# Patient Record
Sex: Female | Born: 1960 | Race: White | Hispanic: No | Marital: Married | Smoking: Never smoker
Health system: Southern US, Community
[De-identification: ages and names within clinical notes are randomized; demographics above are authoritative.]

## PROBLEM LIST (undated history)

## (undated) DIAGNOSIS — M1990 Unspecified osteoarthritis, unspecified site: Secondary | ICD-10-CM

## (undated) DIAGNOSIS — F32A Depression, unspecified: Secondary | ICD-10-CM

## (undated) DIAGNOSIS — K219 Gastro-esophageal reflux disease without esophagitis: Secondary | ICD-10-CM

## (undated) DIAGNOSIS — E786 Lipoprotein deficiency: Secondary | ICD-10-CM

## (undated) DIAGNOSIS — F419 Anxiety disorder, unspecified: Secondary | ICD-10-CM

## (undated) DIAGNOSIS — E119 Type 2 diabetes mellitus without complications: Secondary | ICD-10-CM

## (undated) HISTORY — PX: ROOT CANAL: SHX2363

---
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
## 2014-08-29 DIAGNOSIS — F32A Depression, unspecified: Secondary | ICD-10-CM | POA: Insufficient documentation

## 2014-08-29 DIAGNOSIS — K219 Gastro-esophageal reflux disease without esophagitis: Secondary | ICD-10-CM | POA: Insufficient documentation

## 2014-08-29 DIAGNOSIS — F4321 Adjustment disorder with depressed mood: Secondary | ICD-10-CM | POA: Insufficient documentation

## 2014-08-29 DIAGNOSIS — G4700 Insomnia, unspecified: Secondary | ICD-10-CM | POA: Insufficient documentation

## 2015-04-30 DIAGNOSIS — L918 Other hypertrophic disorders of the skin: Secondary | ICD-10-CM | POA: Insufficient documentation

## 2015-04-30 DIAGNOSIS — Z79899 Other long term (current) drug therapy: Secondary | ICD-10-CM | POA: Insufficient documentation

## 2015-08-03 DIAGNOSIS — Z0000 Encounter for general adult medical examination without abnormal findings: Secondary | ICD-10-CM | POA: Insufficient documentation

## 2016-08-08 DIAGNOSIS — M25551 Pain in right hip: Secondary | ICD-10-CM | POA: Insufficient documentation

## 2017-04-07 DIAGNOSIS — M25531 Pain in right wrist: Secondary | ICD-10-CM | POA: Insufficient documentation

## 2019-01-21 DIAGNOSIS — E1165 Type 2 diabetes mellitus with hyperglycemia: Secondary | ICD-10-CM | POA: Insufficient documentation

## 2021-04-24 ENCOUNTER — Other Ambulatory Visit: Payer: Self-pay

## 2021-04-24 ENCOUNTER — Emergency Department (HOSPITAL_BASED_OUTPATIENT_CLINIC_OR_DEPARTMENT_OTHER)
Admission: EM | Admit: 2021-04-24 | Discharge: 2021-04-24 | Disposition: A | Discharge status: Home / Self Care | Payer: BC Managed Care – PPO | Attending: Emergency Medicine | Admitting: Emergency Medicine

## 2021-04-24 ENCOUNTER — Encounter (HOSPITAL_BASED_OUTPATIENT_CLINIC_OR_DEPARTMENT_OTHER): Payer: Self-pay | Admitting: Urology

## 2021-04-24 ENCOUNTER — Emergency Department (HOSPITAL_BASED_OUTPATIENT_CLINIC_OR_DEPARTMENT_OTHER): Payer: BC Managed Care – PPO

## 2021-04-24 DIAGNOSIS — R220 Localized swelling, mass and lump, head: Secondary | ICD-10-CM | POA: Insufficient documentation

## 2021-04-24 DIAGNOSIS — L03211 Cellulitis of face: Secondary | ICD-10-CM

## 2021-04-24 DIAGNOSIS — E1169 Type 2 diabetes mellitus with other specified complication: Secondary | ICD-10-CM | POA: Insufficient documentation

## 2021-04-24 DIAGNOSIS — E7800 Pure hypercholesterolemia, unspecified: Secondary | ICD-10-CM | POA: Diagnosis not present

## 2021-04-24 HISTORY — DX: Lipoprotein deficiency: E78.6

## 2021-04-24 HISTORY — DX: Depression, unspecified: F32.A

## 2021-04-24 HISTORY — DX: Anxiety disorder, unspecified: F41.9

## 2021-04-24 HISTORY — DX: Type 2 diabetes mellitus without complications: E11.9

## 2021-04-24 LAB — CBC WITH DIFFERENTIAL/PLATELET
Abs Immature Granulocytes: 0.02 10*3/uL (ref 0.00–0.07)
Basophils Absolute: 0 10*3/uL (ref 0.0–0.1)
Basophils Relative: 1 %
Eosinophils Absolute: 0 10*3/uL (ref 0.0–0.5)
Eosinophils Relative: 0 %
HCT: 40.9 % (ref 36.0–46.0)
Hemoglobin: 13.6 g/dL (ref 12.0–15.0)
Immature Granulocytes: 0 %
Lymphocytes Relative: 15 %
Lymphs Abs: 1 10*3/uL (ref 0.7–4.0)
MCH: 30.6 pg (ref 26.0–34.0)
MCHC: 33.3 g/dL (ref 30.0–36.0)
MCV: 92.1 fL (ref 80.0–100.0)
Monocytes Absolute: 0.5 10*3/uL (ref 0.1–1.0)
Monocytes Relative: 8 %
Neutro Abs: 4.9 10*3/uL (ref 1.7–7.7)
Neutrophils Relative %: 76 %
Platelets: 222 10*3/uL (ref 150–400)
RBC: 4.44 MIL/uL (ref 3.87–5.11)
RDW: 14.1 % (ref 11.5–15.5)
WBC: 6.4 10*3/uL (ref 4.0–10.5)
nRBC: 0 % (ref 0.0–0.2)

## 2021-04-24 LAB — BASIC METABOLIC PANEL
Anion gap: 7 (ref 5–15)
BUN: 12 mg/dL (ref 6–20)
CO2: 24 mmol/L (ref 22–32)
Calcium: 8.8 mg/dL — ABNORMAL LOW (ref 8.9–10.3)
Chloride: 107 mmol/L (ref 98–111)
Creatinine, Ser: 0.96 mg/dL (ref 0.44–1.00)
GFR, Estimated: >60 mL/min (ref >60)
Glucose, Bld: 109 mg/dL — ABNORMAL HIGH (ref 70–99)
Potassium: 3.8 mmol/L (ref 3.5–5.1)
Sodium: 138 mmol/L (ref 135–145)

## 2021-04-24 LAB — LACTIC ACID, PLASMA: Lactic Acid, Venous: 0.9 mmol/L (ref 0.5–1.9)

## 2021-04-24 MED ORDER — CLINDAMYCIN PHOSPHATE 300 MG/50ML IV SOLN
300.0000 mg | Freq: Once | INTRAVENOUS | Status: AC
  Administered 2021-04-24: 300 mg via INTRAVENOUS
  Filled 2021-04-24: qty 50

## 2021-04-24 MED ORDER — IOHEXOL 300 MG/ML  SOLN
100.0000 mL | Freq: Once | INTRAMUSCULAR | Status: AC | PRN
  Administered 2021-04-24: 75 mL via INTRAVENOUS

## 2021-04-24 MED ORDER — CLINDAMYCIN HCL 300 MG PO CAPS: 300.0000 mg | ORAL_CAPSULE | Freq: Three times a day (TID) | ORAL | 0 refills | Status: AC

## 2021-04-24 MED ORDER — PROCHLORPERAZINE MALEATE 10 MG PO TABS: 10.0000 mg | ORAL_TABLET | Freq: Once | ORAL | Status: DC

## 2021-04-24 MED ORDER — OXYCODONE-ACETAMINOPHEN 5-325 MG PO TABS: 1.0000 | ORAL_TABLET | Freq: Four times a day (QID) | ORAL | 0 refills | Status: AC | PRN

## 2021-04-24 MED ORDER — KETOROLAC TROMETHAMINE 15 MG/ML IJ SOLN
15.0000 mg | Freq: Once | INTRAMUSCULAR | Status: AC
  Administered 2021-04-24: 15 mg via INTRAVENOUS
  Filled 2021-04-24: qty 1

## 2021-04-24 NOTE — ED Notes (Signed)
Patient transported to CT 

## 2021-04-24 NOTE — ED Provider Notes (Signed)
[facility] [place] EMERGENCY DEPARTMENT Provider Note   CSN: [id number] Arrival date & time: 04/24/21  0841     History Chief Complaint  Patient presents with  . Facial Swelling    Joann Carpenter is a 60 y.o. female.  Patient presents to ER chief complaint of right-sided facial swelling.  Symptoms began yesterday.  She states that she saw her dentist today as well as yesterday when she had a dental procedure done describes as a root canal done yesterday without any complication she was sent home.  She notes increased swelling of the right face today went to see her dentist today who sent her to the ER for further evaluation.  Patient otherwise denies any fevers or cough no vomiting or diarrhea.        Past Medical History:  Diagnosis Date  . Anxiety   . Depression   . Diabetes mellitus without complication (HCC)   . Hypocholesteremia     There are no problems to display for this patient.   Past Surgical History:  Procedure Laterality Date  . ROOT CANAL       OB History   No obstetric history on file.     History reviewed. No pertinent family history.  Social History   Tobacco Use  . Smoking status: Never Smoker  . Smokeless tobacco: Never Used  Substance Use Topics  . Alcohol use: Yes    Comment: occasionally   . Drug use: Never    Home Medications Prior to Admission medications   Medication Sig Start Date End Date Taking? Authorizing Provider  clindamycin (CLEOCIN) 300 MG capsule Take 1 capsule (300 mg total) by mouth 3 (three) times daily for 10 days. 04/24/21 05/04/21 Yes Teaira Croft, Eustace Moore, MD    Allergies    Codeine  Review of Systems   Review of Systems  Constitutional: Negative for fever.  HENT: Negative for ear pain.   Eyes: Negative for pain.  Respiratory: Negative for cough.   Cardiovascular: Negative for chest pain.  Gastrointestinal: Negative for abdominal pain.  Genitourinary: Negative for flank pain.  Musculoskeletal: Negative for back  pain.  Skin: Negative for rash.  Neurological: Negative for headaches.    Physical Exam Updated Vital Signs BP (!) 126/51 (BP Location: Right Arm)   Pulse 64   Temp 98.2 F (36.8 C) (Oral)   Resp 18   Ht 5\' 2"  (1.575 m)   Wt 77.1 kg   SpO2 99%   BMI 31.09 kg/m   Physical Exam Constitutional:      General: She is not in acute distress.    Appearance: Normal appearance.  HENT:     Head: Normocephalic.     Nose: Nose normal.  Eyes:     Extraocular Movements: Extraocular movements intact.     Conjunctiva/sclera: Conjunctivae normal.     Pupils: Pupils are equal, round, and reactive to light.  Cardiovascular:     Rate and Rhythm: Normal rate.  Pulmonary:     Effort: Pulmonary effort is normal.  Musculoskeletal:        General: Normal range of motion.     Cervical back: Normal range of motion.  Skin:    Comments: Patient presents with right-sided maxillary tenderness swelling and increased warmth.  Neurological:     General: No focal deficit present.     Mental Status: She is alert. Mental status is at baseline.     ED Results / Procedures / Treatments   Labs (all labs ordered are listed,  but only abnormal results are displayed) Labs Reviewed  BASIC METABOLIC PANEL - Abnormal; Notable for the following components:      Result Value   Glucose, Bld 109 (*)    Calcium 8.8 (*)    All other components within normal limits  CULTURE, BLOOD (ROUTINE X 2)  CULTURE, BLOOD (ROUTINE X 2)  CBC WITH DIFFERENTIAL/PLATELET  LACTIC ACID, PLASMA    EKG None  Radiology CT Maxillofacial W Contrast  Result Date: 04/24/2021 CLINICAL DATA:  Dental disease. Root canal yesterday on the right. Right-sided swelling. EXAM: CT MAXILLOFACIAL WITH CONTRAST TECHNIQUE: Multidetector CT imaging of the maxillofacial structures was performed with intravenous contrast. Multiplanar CT image reconstructions were also generated. CONTRAST:  22mL OMNIPAQUE IOHEXOL 300 MG/ML  SOLN COMPARISON:  None.  FINDINGS: Osseous: Osteoarthritis both temporomandibular joints. Maxillary dentition shows root canal at tooth 9 with lucency around the root and anterior cortical breakthrough. This could be the source of the facial inflammation. Crown of tooth 5, with lucency around the roots and lateral cortical breakthrough that could also be a source of inflammation. Crescentic low-density along the bone adjacent to this tooth makes this the most likely source of the acute inflammation. With respect to the maxillary dentition, there is lucency around the roots of tooth 28 which could also be a source of inflammation. Orbits: Periorbital soft tissue swelling on the right. No postseptal orbital inflammation. No other orbital finding. Sinuses: Mucosal thickening along the floor of the right maxillary sinus. No layering fluid. Other sinuses clear. Soft tissues: Nonspecific edema of the right face consistent with cellulitis. No evidence of drainable abscess. Limited intracranial: Normal IMPRESSION: Right facial and preseptal periorbital cellulitis on the right. Tooth 5 has a crown and there is lucency around the roots with lateral cortical breakthrough. Crescentic low density fluid adjacent to the bone, suggesting that this is the most likely source of the acute inflammation. Root canal tooth 9 with anterior cortical breakthrough that could possibly contribute. Periodontal disease of mandibular tooth 28 could also be symptomatic. Electronically Signed   By: Paulina Fusi M.D.   On: 04/24/2021 11:01    Procedures Procedures   Medications Ordered in ED Medications  clindamycin (CLEOCIN) IVPB 300 mg (has no administration in time range)  ketorolac (TORADOL) 15 MG/ML injection 15 mg (15 mg Intravenous Given 04/24/21 0932)  iohexol (OMNIPAQUE) 300 MG/ML solution 100 mL (75 mLs Intravenous Contrast Given 04/24/21 1034)    ED Course  I have reviewed the triage vital signs and the nursing notes.  Pertinent labs & imaging results  that were available during my care of the patient were reviewed by me and considered in my medical decision making (see chart for details).    MDM Rules/Calculators/A&P                          Given recent dental procedure and facial swelling, concern for underlying abscess.  Labs otherwise unremarkable white count normal lactic acid normal blood cultures are sent and pending.  CT scan shows evidence of cellulitis but no loculated abscess noted.  Small amount of fluid along the to space noted per radiologist.  Patient given IV clindamycin here, prescribed clindamycin to go home with.  Advise follow-up with her dentist again within the next 2 to 3 days.  Recommending immediate return for worsening symptoms or any additional concerns.  Final Clinical Impression(s) / ED Diagnoses Final diagnoses:  None    Rx / DC Orders ED  Discharge Orders         Ordered    clindamycin (CLEOCIN) 300 MG capsule  3 times daily        04/24/21 1115           Cheryll Cockayne, MD 04/24/21 1115

## 2021-04-24 NOTE — Discharge Instructions (Signed)
Call your primary care doctor or specialist as discussed in the next 2-3 days.   Return immediately back to the ER if:  Your symptoms worsen within the next 12-24 hours. You develop new symptoms such as new fevers, persistent vomiting, new pain, shortness of breath, or new weakness or numbness, or if you have any other concerns.  

## 2021-04-24 NOTE — ED Notes (Signed)
ED Provider at bedside. 

## 2021-04-24 NOTE — ED Notes (Signed)
Pt given crackers and drink. Pt resting comfortably

## 2021-04-24 NOTE — ED Triage Notes (Signed)
Had dental root canal yesteray on upper right tooth, no c/o facial swelling to right side with eye incorporated into swelling, sent by DDS for r/o hematoma

## 2021-04-29 LAB — CULTURE, BLOOD (ROUTINE X 2)
Culture: NO GROWTH
Culture: NO GROWTH
Special Requests: ADEQUATE

## 2021-12-21 IMAGING — CT CT MAXILLOFACIAL W/ CM
3 series · 15 of 47 positions shown, 18 images · IV contrast (Omnipaque)
Comparison: None.

CLINICAL DATA: Dental disease. Root canal yesterday on the right.
Right-sided swelling.

EXAM:
CT MAXILLOFACIAL WITH CONTRAST
TECHNIQUE: Multidetector CT imaging of the maxillofacial structures was
performed with intravenous contrast. Multiplanar CT image
reconstructions were also generated.
CONTRAST:  75mL OMNIPAQUE IOHEXOL 300 MG/ML  SOLN

[Series 2: max soft · axial · 0.35mm/px · z∈[-240,-112]mm · 9 of 76 slices shown, 12 images]
[im 6/76  brain]
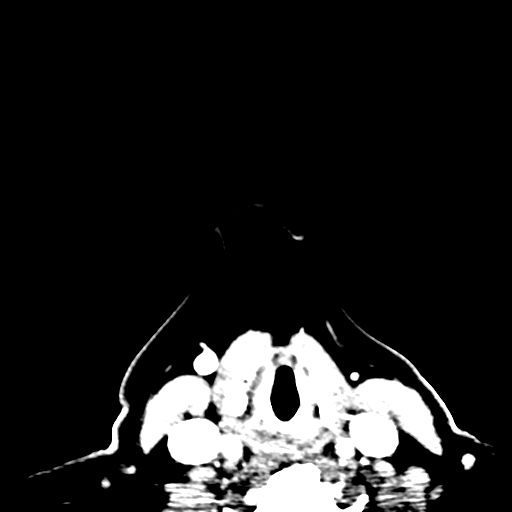
[im 6/76  bone]
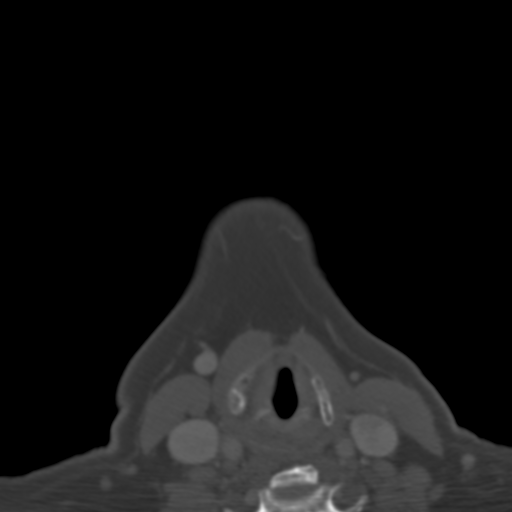
[im 13/76  bone]
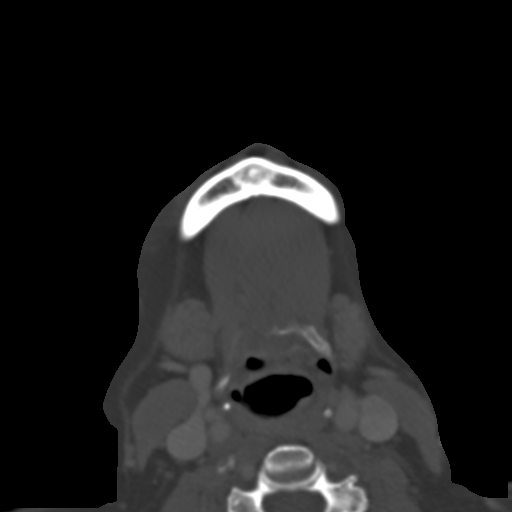
[im 21/76  bone]
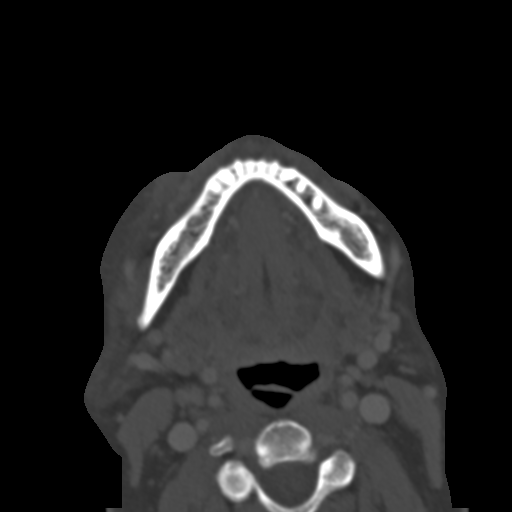
[im 29/76  bone]
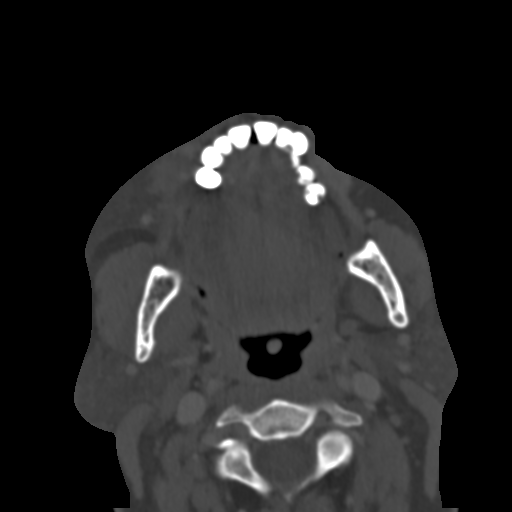
[im 39/76  brain]
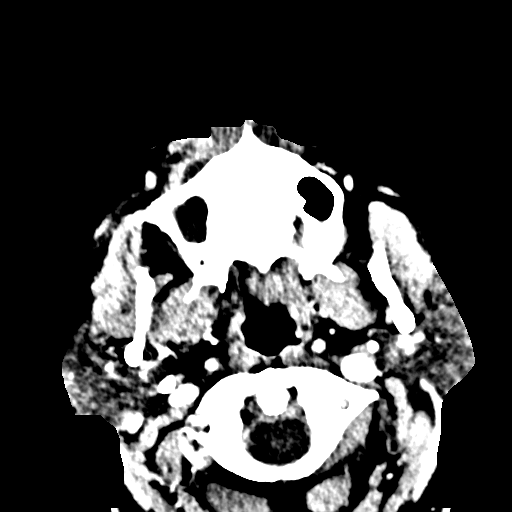
[im 39/76  bone]
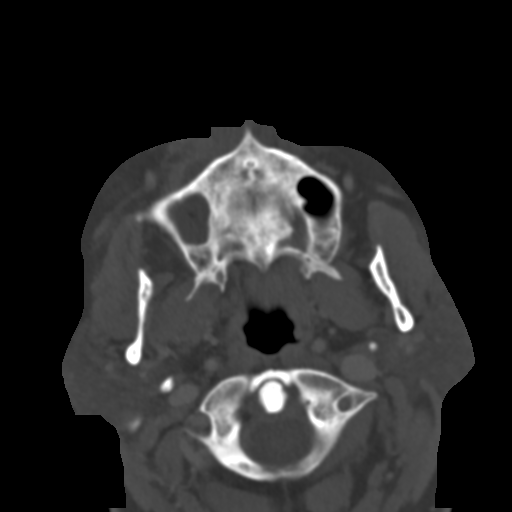
[im 47/76  bone]
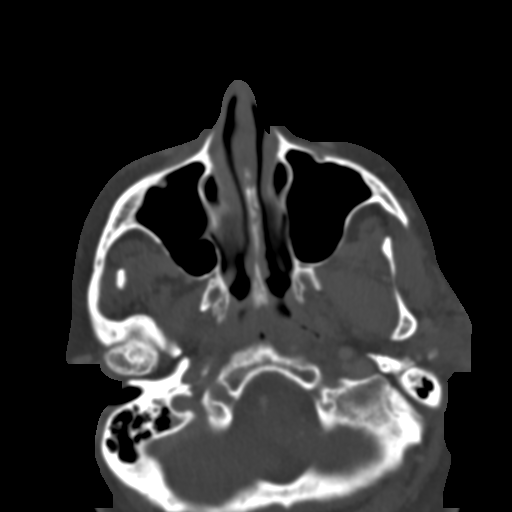
[im 55/76  bone]
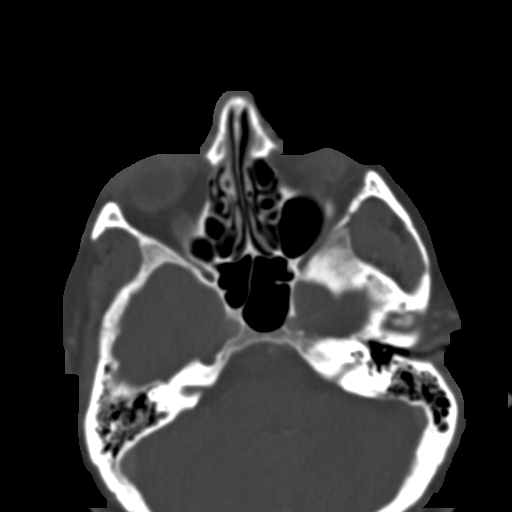
[im 63/76  bone]
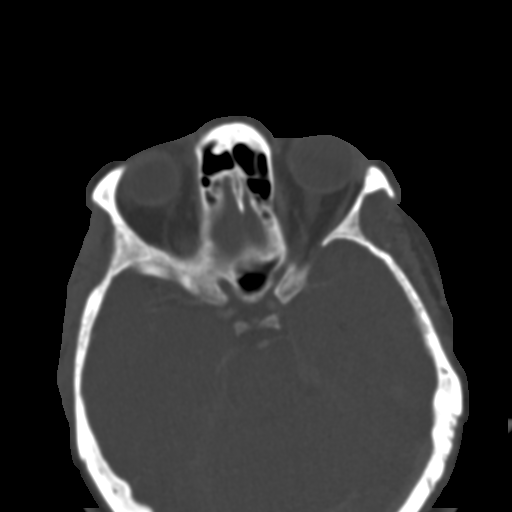
[im 70/76  brain]
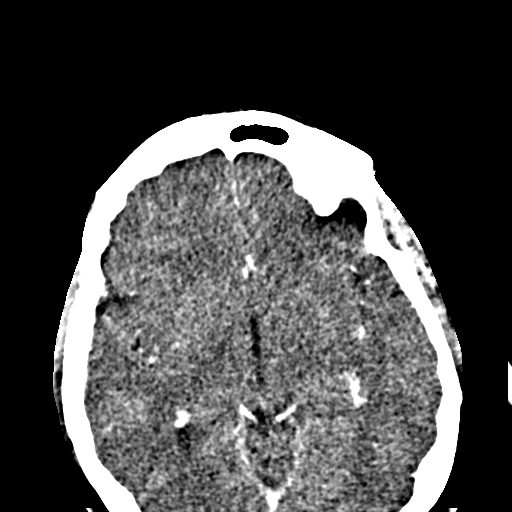
[im 70/76  bone]
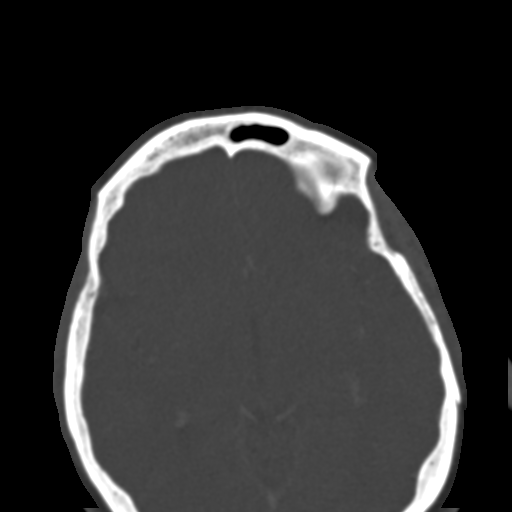

[Series 6: coronal soft · coronal · 0.36mm/px · 3 of 81 slices shown]
[im 27/81  bone]
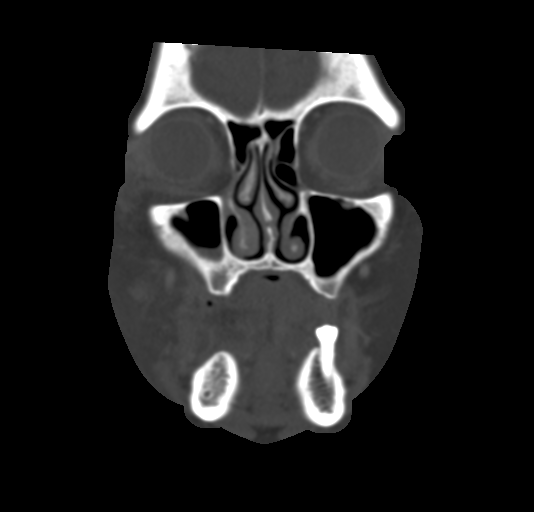
[im 36/81  bone]
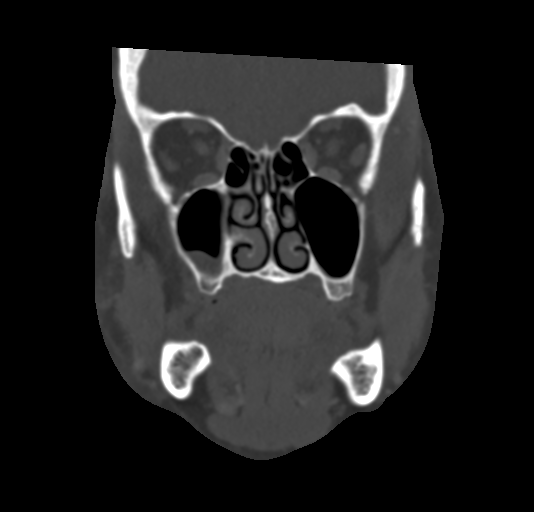
[im 45/81  bone]
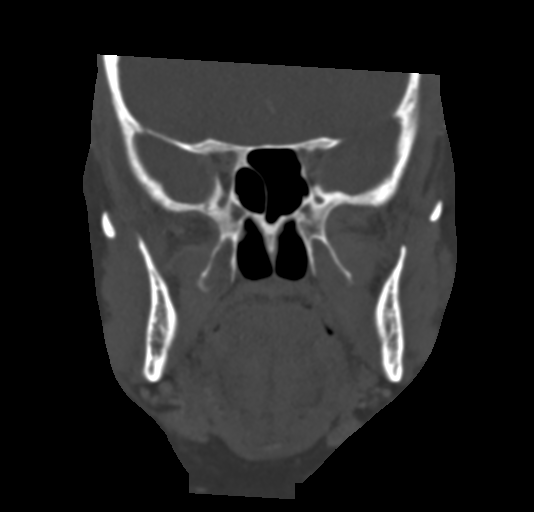

[Series 8: sagittal soft · sagittal · 0.31mm/px · 3 of 85 slices shown]
[im 29/85  bone]
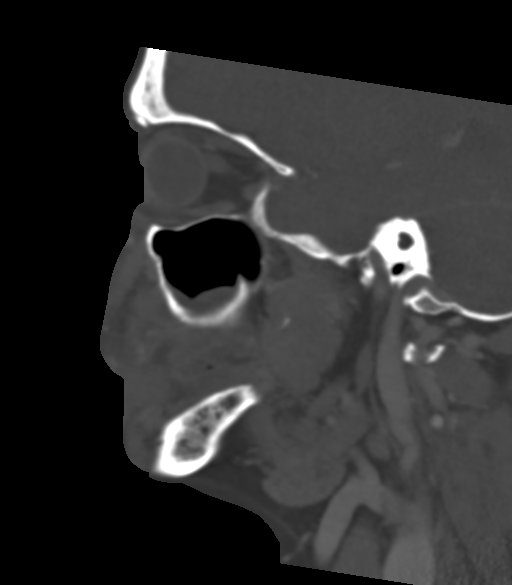
[im 43/85  bone]
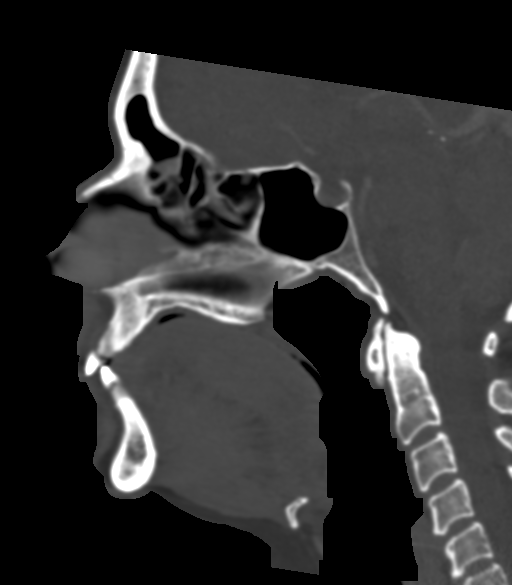
[im 57/85  bone]
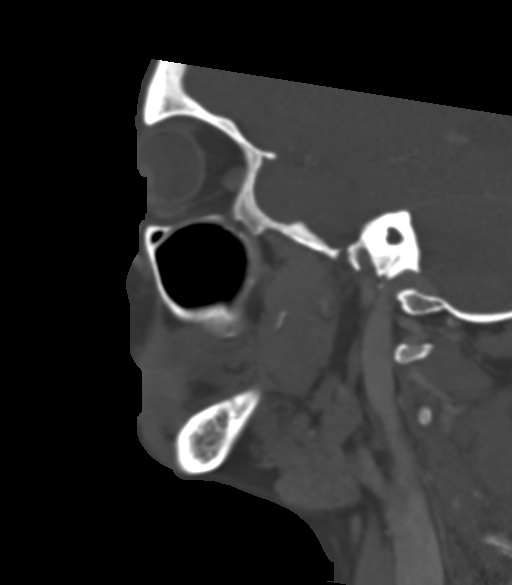

[15 of 47 positions shown; findings below may reference images not displayed]

FINDINGS: Osseous: Osteoarthritis both temporomandibular joints. Maxillary
dentition shows root canal at tooth 9 with lucency around the root
and anterior cortical breakthrough. This could be the source of the
facial inflammation. Crown of tooth 5, with lucency around the roots
and lateral cortical breakthrough that could also be a source of
inflammation. Crescentic low-density along the bone adjacent to this
tooth makes this the most likely source of the acute inflammation.
With respect to the maxillary dentition, there is lucency around the
roots of tooth 28 which could also be a source of inflammation.

Orbits: Periorbital soft tissue swelling on the right. No postseptal
orbital inflammation. No other orbital finding.

Sinuses: Mucosal thickening along the floor of the right maxillary
sinus. No layering fluid. Other sinuses clear.

Soft tissues: Nonspecific edema of the right face consistent with
cellulitis. No evidence of drainable abscess.

Limited intracranial: Normal
IMPRESSION: Right facial and preseptal periorbital cellulitis on the right.

Tooth 5 has a crown and there is lucency around the roots with
lateral cortical breakthrough. Crescentic low density fluid adjacent
to the bone, suggesting that this is the most likely source of the
acute inflammation.

Root canal tooth 9 with anterior cortical breakthrough that could
possibly contribute.

Periodontal disease of mandibular tooth 28 could also be
symptomatic.

## 2022-05-10 DIAGNOSIS — G8929 Other chronic pain: Secondary | ICD-10-CM | POA: Insufficient documentation

## 2022-05-10 DIAGNOSIS — B351 Tinea unguium: Secondary | ICD-10-CM | POA: Insufficient documentation

## 2022-05-10 DIAGNOSIS — J302 Other seasonal allergic rhinitis: Secondary | ICD-10-CM | POA: Insufficient documentation

## 2022-05-10 DIAGNOSIS — F339 Major depressive disorder, recurrent, unspecified: Secondary | ICD-10-CM | POA: Insufficient documentation

## 2022-06-02 DIAGNOSIS — M65332 Trigger finger, left middle finger: Secondary | ICD-10-CM | POA: Insufficient documentation

## 2024-01-03 ENCOUNTER — Telehealth: Payer: Self-pay | Admitting: Family Medicine

## 2024-01-03 ENCOUNTER — Other Ambulatory Visit: Payer: Self-pay

## 2024-01-03 DIAGNOSIS — E1165 Type 2 diabetes mellitus with hyperglycemia: Secondary | ICD-10-CM

## 2024-01-03 DIAGNOSIS — E7800 Pure hypercholesterolemia, unspecified: Secondary | ICD-10-CM | POA: Insufficient documentation

## 2024-01-03 MED ORDER — OZEMPIC (1 MG/DOSE) 4 MG/3ML ~~LOC~~ SOPN: 1.0000 mg | PEN_INJECTOR | SUBCUTANEOUS | 3 refills | Status: DC

## 2024-01-03 NOTE — Telephone Encounter (Signed)
Copied from CRM [id number]. Topic: Clinical - Medication Refill >> Jan 03, 2024  9:18 AM Phill Myron wrote: Most Recent Primary Care Visit:    Medication:  Ozempic   Has the patient contacted their pharmacy? Yes (Agent: If no, request that the patient contact the pharmacy for the refill. If patient does not wish to contact the pharmacy document the reason why and proceed with request.) (Agent: If yes, when and what did the pharmacy advise?)  Is this the correct pharmacy for this prescription? Yes If no, delete pharmacy and type the correct one.  This is the patient's preferred pharmacy:   [id number] - [place], [place] - [address] [place] [address] Phone: [phone] Fax: [phone]       Is the patient out of the medication? Yes  Has the patient been seen for an appointment in the last year OR does the patient have an upcoming appointment? Yes  Can we respond through MyChart? No  Agent: Please be advised that Rx refills may take up to 3 business days. We ask that you follow-up with your pharmacy.

## 2024-02-02 ENCOUNTER — Telehealth: Payer: Self-pay

## 2024-02-02 ENCOUNTER — Other Ambulatory Visit: Payer: Self-pay

## 2024-02-02 DIAGNOSIS — E1165 Type 2 diabetes mellitus with hyperglycemia: Secondary | ICD-10-CM

## 2024-02-02 DIAGNOSIS — E7800 Pure hypercholesterolemia, unspecified: Secondary | ICD-10-CM

## 2024-02-02 MED ORDER — OZEMPIC (1 MG/DOSE) 4 MG/3ML ~~LOC~~ SOPN
1.0000 mg | PEN_INJECTOR | SUBCUTANEOUS | 3 refills | Status: DC
Start: 2024-02-02 — End: 2024-04-23

## 2024-02-02 NOTE — Telephone Encounter (Signed)
 Copied from CRM [id number]. Topic: Clinical - Prescription Issue >> Feb 02, 2024 11:43 AM Geroge Baseman wrote: Reason for CRM: Semaglutide, 1 MG/DOSE, (OZEMPIC, 1 MG/DOSE,) 4 MG/3ML SOPN, patient is needing a refill but she is not scheduled to see the doctor until April 11th. Req. Courtesy refill Please call if there are any issues.

## 2024-03-01 ENCOUNTER — Ambulatory Visit: Payer: Self-pay | Admitting: Family Medicine

## 2024-04-09 ENCOUNTER — Other Ambulatory Visit: Payer: Self-pay | Admitting: Family Medicine

## 2024-04-09 NOTE — Telephone Encounter (Signed)
 Copied from CRM [id number]. Topic: Clinical - Medication Refill >> Apr 09, 2024 10:26 AM Baldomero Bone wrote: Medication: traZODone (DESYREL) 50 MG tablet  Has the patient contacted their pharmacy? Yes (Agent: If no, request that the patient contact the pharmacy for the refill. If patient does not wish to contact the pharmacy document the reason why and proceed with request.) (Agent: If yes, when and what did the pharmacy advise?)  This is the patient's preferred pharmacy:  [id number] - [place], [place] - [address] [place] [address] Phone: [phone] Fax: [phone]  Is this the correct pharmacy for this prescription? Yes If no, delete pharmacy and type the correct one.   Has the prescription been filled recently? No  Is the patient out of the medication? Yes  Has the patient been seen for an appointment in the last year OR does the patient have an upcoming appointment? Yes  Can we respond through MyChart? No  Agent: Please be advised that Rx refills may take up to 3 business days. We ask that you follow-up with your pharmacy.

## 2024-04-10 NOTE — Telephone Encounter (Signed)
 Requested medication (s) are due for refill today: yes  Requested medication (s) are on the active medication list: hx med Last refill:  01/03/24  Future visit scheduled: yes  Notes to clinic:  hx provider   Requested Prescriptions  Pending Prescriptions Disp Refills   traZODone (DESYREL) 50 MG tablet      Sig: Take 1 tablet (50 mg total) by mouth at bedtime.     Psychiatry: Antidepressants - Serotonin Modulator Failed - 04/10/2024  4:11 PM      Failed - Completed PHQ-2 or PHQ-9 in the last 360 days      Failed - Valid encounter within last 6 months    Recent Outpatient Visits   None

## 2024-04-12 MED ORDER — TRAZODONE HCL 50 MG PO TABS: 50.0000 mg | ORAL_TABLET | Freq: Every day | ORAL | 1 refills | Status: DC

## 2024-04-23 ENCOUNTER — Other Ambulatory Visit: Payer: Self-pay | Admitting: Family Medicine

## 2024-04-23 DIAGNOSIS — E7800 Pure hypercholesterolemia, unspecified: Secondary | ICD-10-CM

## 2024-04-23 DIAGNOSIS — E1165 Type 2 diabetes mellitus with hyperglycemia: Secondary | ICD-10-CM

## 2024-05-15 ENCOUNTER — Emergency Department (HOSPITAL_BASED_OUTPATIENT_CLINIC_OR_DEPARTMENT_OTHER)
Admission: EM | Admit: 2024-05-15 | Discharge: 2024-05-15 | Disposition: A | Discharge status: Home / Self Care | Attending: Emergency Medicine | Admitting: Emergency Medicine

## 2024-05-15 ENCOUNTER — Emergency Department (HOSPITAL_BASED_OUTPATIENT_CLINIC_OR_DEPARTMENT_OTHER)

## 2024-05-15 ENCOUNTER — Other Ambulatory Visit: Payer: Self-pay

## 2024-05-15 ENCOUNTER — Encounter (HOSPITAL_BASED_OUTPATIENT_CLINIC_OR_DEPARTMENT_OTHER): Payer: Self-pay

## 2024-05-15 DIAGNOSIS — Z79899 Other long term (current) drug therapy: Secondary | ICD-10-CM | POA: Diagnosis not present

## 2024-05-15 DIAGNOSIS — E119 Type 2 diabetes mellitus without complications: Secondary | ICD-10-CM | POA: Insufficient documentation

## 2024-05-15 DIAGNOSIS — Z7984 Long term (current) use of oral hypoglycemic drugs: Secondary | ICD-10-CM | POA: Diagnosis not present

## 2024-05-15 DIAGNOSIS — N838 Other noninflammatory disorders of ovary, fallopian tube and broad ligament: Secondary | ICD-10-CM

## 2024-05-15 DIAGNOSIS — N83202 Unspecified ovarian cyst, left side: Secondary | ICD-10-CM | POA: Insufficient documentation

## 2024-05-15 DIAGNOSIS — R1031 Right lower quadrant pain: Secondary | ICD-10-CM | POA: Diagnosis present

## 2024-05-15 DIAGNOSIS — D72829 Elevated white blood cell count, unspecified: Secondary | ICD-10-CM | POA: Diagnosis not present

## 2024-05-15 LAB — COMPREHENSIVE METABOLIC PANEL WITH GFR
ALT: 21 U/L (ref 0–44)
AST: 20 U/L (ref 15–41)
Albumin: 4.8 g/dL (ref 3.5–5.0)
Alkaline Phosphatase: 62 U/L (ref 38–126)
Anion gap: 17 — ABNORMAL HIGH (ref 5–15)
BUN: 16 mg/dL (ref 8–23)
CO2: 21 mmol/L — ABNORMAL LOW (ref 22–32)
Calcium: 10.2 mg/dL (ref 8.9–10.3)
Chloride: 101 mmol/L (ref 98–111)
Creatinine, Ser: 0.95 mg/dL (ref 0.44–1.00)
GFR, Estimated: >60 mL/min (ref >60)
Glucose, Bld: 143 mg/dL — ABNORMAL HIGH (ref 70–99)
Potassium: 4.2 mmol/L (ref 3.5–5.1)
Sodium: 139 mmol/L (ref 135–145)
Total Bilirubin: 0.9 mg/dL (ref 0.0–1.2)
Total Protein: 8 g/dL (ref 6.5–8.1)

## 2024-05-15 LAB — URINALYSIS, ROUTINE W REFLEX MICROSCOPIC
Glucose, UA: NEGATIVE mg/dL
Hgb urine dipstick: NEGATIVE
Ketones, ur: NEGATIVE mg/dL
Nitrite: NEGATIVE
Protein, ur: 30 mg/dL — AB
Specific Gravity, Urine: >=1.03 (ref 1.005–1.030)
pH: 5.5 (ref 5.0–8.0)

## 2024-05-15 LAB — CBC
HCT: 42.4 % (ref 36.0–46.0)
Hemoglobin: 14.9 g/dL (ref 12.0–15.0)
MCH: 31.1 pg (ref 26.0–34.0)
MCHC: 35.1 g/dL (ref 30.0–36.0)
MCV: 88.5 fL (ref 80.0–100.0)
Platelets: 347 10*3/uL (ref 150–400)
RBC: 4.79 MIL/uL (ref 3.87–5.11)
RDW: 12.2 % (ref 11.5–15.5)
WBC: 18.9 10*3/uL — ABNORMAL HIGH (ref 4.0–10.5)
nRBC: 0 % (ref 0.0–0.2)

## 2024-05-15 LAB — LIPASE, BLOOD: Lipase: 41 U/L (ref 11–51)

## 2024-05-15 LAB — URINALYSIS, MICROSCOPIC (REFLEX)

## 2024-05-15 LAB — CBG MONITORING, ED: Glucose-Capillary: 143 mg/dL — ABNORMAL HIGH (ref 70–99)

## 2024-05-15 MED ORDER — METOCLOPRAMIDE HCL 10 MG PO TABS
5.0000 mg | ORAL_TABLET | Freq: Four times a day (QID) | ORAL | 0 refills | Status: DC
Start: 2024-05-15 — End: 2024-07-01

## 2024-05-15 MED ORDER — ONDANSETRON 4 MG PO TBDP: 4.0000 mg | ORAL_TABLET | Freq: Three times a day (TID) | ORAL | 0 refills | Status: DC | PRN

## 2024-05-15 MED ORDER — ONDANSETRON 4 MG PO TBDP
4.0000 mg | ORAL_TABLET | Freq: Once | ORAL | Status: AC | PRN
  Administered 2024-05-15: 4 mg via ORAL
  Filled 2024-05-15: qty 1

## 2024-05-15 MED ORDER — SODIUM CHLORIDE 0.9 % IV BOLUS
1000.0000 mL | Freq: Once | INTRAVENOUS | Status: AC
  Administered 2024-05-15: 1000 mL via INTRAVENOUS

## 2024-05-15 MED ORDER — METOCLOPRAMIDE HCL 5 MG/ML IJ SOLN
5.0000 mg | Freq: Once | INTRAMUSCULAR | Status: AC
Start: 2024-05-15 — End: 2024-05-15
  Administered 2024-05-15: 5 mg via INTRAVENOUS
  Filled 2024-05-15: qty 2

## 2024-05-15 MED ORDER — IOHEXOL 300 MG/ML  SOLN
100.0000 mL | Freq: Once | INTRAMUSCULAR | Status: AC | PRN
  Administered 2024-05-15: 100 mL via INTRAVENOUS

## 2024-05-15 MED ORDER — METOCLOPRAMIDE HCL 10 MG PO TABS: 5.0000 mg | ORAL_TABLET | Freq: Four times a day (QID) | ORAL | 0 refills | Status: DC

## 2024-05-15 NOTE — ED Triage Notes (Addendum)
 Patient arrives POV with complaints of lower abdominal pain x3 days. Patient also reports vomiting when she tries to eat as well. Rates pain a 6/10.  Hx of diabetes

## 2024-05-15 NOTE — ED Provider Notes (Signed)
 [facility] Provider Note   CSN: [id number] Arrival date & time: 05/15/24  1526     Patient presents with: Abdominal Pain and Nausea   Joann Carpenter is a 63 y.o. female.  With past medical history of diabetes, GERD presents to emergency room with complaint of bilateral lower abdominal pain associated with nausea and vomiting every time she is standing.  She does not think she has had a bowel movement 3 days and does not think she is passing gas.  She reports the pain is constant and worse when pressing on the area.  She does not think that she has had fever.  No sick contact with similar symptoms.  No suspicious food intake.  No recent travel or antibiotic use.     Abdominal Pain      Prior to Admission medications   Medication Sig Start Date End Date Taking? Authorizing Provider  glucose blood (CONTOUR NEXT TEST) test strip USE TO TEST BLOOD SUGAR EVERY DAY 05/30/19   [provider]  glucose blood (CONTOUR NEXT TEST) test strip USE TO TEST BLOOD SUGAR EVERY DAY 05/30/19   [provider]  Lancets MISC Use 1 lancet to check blood sugar daily. 12/11/18   [provider]  lidocaine (XYLOCAINE) 1 % (with preservative) injection by Infiltration route. 09/01/21   [provider]  lisinopril (ZESTRIL) 5 MG tablet Take 5 mg by mouth daily.    [provider]  meloxicam (MOBIC) 15 MG tablet Take 1 tablet by mouth daily. 09/14/21   [provider]  metFORMIN (GLUCOPHAGE-XR) 500 MG 24 hr tablet Take by mouth. 09/02/22   [provider]  OZEMPIC , 1 MG/DOSE, 4 MG/3ML SOPN INJECT 1  MG INTO THE SKIN  ONCE A WEEK 04/23/24   Aletha Bene, MD  pioglitazone (ACTOS) 30 MG tablet Take 1 tablet by mouth daily. 01/26/21   [provider]  simvastatin (ZOCOR) 40 MG tablet Take 40 mg by mouth at bedtime.    [provider]  terbinafine (LAMISIL) 250 MG tablet Take 250 mg by mouth daily.  08/11/23   [provider]  traZODone  (DESYREL ) 50 MG tablet Take 1 tablet (50 mg total) by mouth at bedtime. 04/12/24   Aguiar, Rafaela, MD  triamcinolone acetonide (KENALOG-40) 40 MG/ML injection Inject into the articular space. 09/01/21   [provider]  zolpidem (AMBIEN) 10 MG tablet Take by mouth. 04/07/17   [provider]    Allergies: Codeine    Review of Systems  Gastrointestinal:  Positive for abdominal pain.    Updated Vital Signs BP (!) 140/86   Pulse 99   Temp 98.7 F (37.1 C) (Oral)   Resp 18   Ht 5' 2 (1.575 m)   Wt 59 kg   SpO2 98%   BMI 23.78 kg/m   Physical Exam Vitals and nursing note reviewed.  Constitutional:      General: She is not in acute distress.    Appearance: She is not toxic-appearing.  HENT:     Head: Normocephalic and atraumatic.   Eyes:     General: No scleral icterus.    Conjunctiva/sclera: Conjunctivae normal.    Cardiovascular:     Rate and Rhythm: Normal rate and regular rhythm.     Pulses: Normal pulses.     Heart sounds: Normal heart sounds.  Pulmonary:     Effort: Pulmonary effort is normal. No respiratory distress.     Breath sounds: Normal breath  sounds.  Abdominal:     General: Abdomen is flat. Bowel sounds are normal.     Palpations: Abdomen is soft.     Tenderness: There is abdominal tenderness in the right lower quadrant, suprapubic area and left lower quadrant.   Skin:    General: Skin is warm and dry.     Findings: No lesion.   Neurological:     General: No focal deficit present.     Mental Status: She is alert and oriented to person, place, and time. Mental status is at baseline.     (all labs ordered are listed, but only abnormal results are displayed) Labs Reviewed  COMPREHENSIVE METABOLIC PANEL WITH GFR - Abnormal; Notable for the following components:      Result Value   CO2 21 (*)    Glucose, Bld 143 (*)    Anion gap 17 (*)    All other components within normal limits   CBC - Abnormal; Notable for the following components:   WBC 18.9 (*)    All other components within normal limits  CBG MONITORING, ED - Abnormal; Notable for the following components:   Glucose-Capillary 143 (*)    All other components within normal limits  LIPASE, BLOOD  URINALYSIS, ROUTINE W REFLEX MICROSCOPIC    EKG: None  Radiology: CT ABDOMEN PELVIS W CONTRAST Result Date: 05/15/2024 CLINICAL DATA:  Lower abdominal pain EXAM: CT ABDOMEN AND PELVIS WITH CONTRAST TECHNIQUE: Multidetector CT imaging of the abdomen and pelvis was performed using the standard protocol following bolus administration of intravenous contrast. RADIATION DOSE REDUCTION: This exam was performed according to the departmental dose-optimization program which includes automated exposure control, adjustment of the mA and/or kV according to patient size and/or use of iterative reconstruction technique. CONTRAST:  100mL OMNIPAQUE  IOHEXOL  300 MG/ML  SOLN COMPARISON:  None Available. FINDINGS: Lower chest: No acute abnormality Hepatobiliary: No focal hepatic abnormality. Gallbladder unremarkable. Pancreas: No focal abnormality or ductal dilatation. Spleen: No focal abnormality.  Normal size. Adrenals/Urinary Tract: Small left adrenal nodules measuring 10 mm and 9 mm and greatest diameter. These are nonspecific but likely reflect adenomas. Right adrenal gland normal. No focal renal abnormality. No stones or hydronephrosis. Urinary bladder unremarkable. Stomach/Bowel: Normal appendix. Stomach, large and small bowel grossly unremarkable. Vascular/Lymphatic: Aortic atherosclerosis. No evidence of aneurysm or adenopathy. Reproductive: Uterus and left ovary unremarkable. Large cystic masses centrally in the pelvis and in the right adnexa. The largest measures 9.1 x 8.1 cm centrally in the pelvis. Right adnexal cyst measures 6.5 x 4.8 cm. These presumably arise from the right ovary although the right ovary not definitively seen. Other:  No free fluid or free air. Musculoskeletal: No acute bony abnormality. IMPRESSION: Large cystic masses centrally in the pelvis and in the right adnexa, presumably arising from the right ovary. 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 These could be further evaluated with ultrasound. Aortic atherosclerosis. Electronically Signed   By: Franky Crease M.D.   On: 05/15/2024 18:14     Procedures   Medications Ordered in the ED  ondansetron (ZOFRAN-ODT) disintegrating tablet 4 mg (4 mg Oral Given 05/15/24 1546)    Clinical Course as of 05/15/24 2128  Wed May 15, 2024  2020 [phone] [facility] # [JB]  2023 I spoke with Dr. Tomblin who will refer patient to OB/GYN.  OB/GYN should call to schedule appointment.  Stable for discharge with outpatient follow-up. [JB]  2038 Patient tolerating oral intake.  Requesting discharge.  Expresses understanding of diagnosis and follow-up instructions. [JB]  Clinical Course User Index [JB] Sabri Teal, Warren SAILOR, PA-C                                 Medical Decision Making Amount and/or Complexity of Data Reviewed Labs: ordered. Radiology: ordered.  Risk Prescription drug management.   This patient presents to the ED for concern of abdominal pain, this involves an extensive number of treatment options, and is a complaint that carries with it a high risk of complications and morbidity.  The differential diagnosis includes appendicitis, diverticulitis, urinary tract infection, pyelonephritis, gastroenteritis, small bowel obstruction   Co morbidities that complicate the patient evaluation  DM2   Additional history obtained:  Additional history obtained from OV yesterday for similar symptoms, encouraged ER visit    Lab Tests:  I personally interpreted labs.  The pertinent results include:   Has leukocytosis of 18, no anemia UA is positive for small leukocytes, few bacteria does endorse suprapubic pain.    Imaging Studies ordered:  I ordered  imaging studies including ct abd/pelvis  I independently visualized and interpreted imaging which showed left adnexal fullness recommending ultrasound of pelvis.  Findings concerning for neoplasm. I agree with the radiologist interpretation Pelvic ultrasound: RIght adnexal mass concerning for cancer with thickened endometrial lining.   Cardiac Monitoring: / EKG:  The patient was maintained on a cardiac monitor.     Consultations Obtained:  I requested consultation with the Dr. Evangeline OB/[facility],  and discussed lab and imaging findings as well as pertinent plan - they recommend: Follow-up in office   Problem List / ED Course / Critical interventions / Medication management  Patient presents to emergency room with lower abdominal pain with associated nausea vomiting.  This has been ongoing for approximately 3 days.  She is hemodynamically stable and well-appearing.  Patient has reported very poor oral intake associated with this and has elevated anion gap.  No electrolyte abnormality.  Normal kidney function.  She has leukocytosis at 18.9 which I feel is most likely reactive.  She has no fever.  Will trial fluids and Reglan and reassess. Patient is feeling better after Reglan and normal saline.  Her CT scan shows adnexal mass with characteristics concerning for ovarian neoplasm. Pelvic ultrasound shows right adnexal mass x 3 as well as thickened endometrial lining.  Overall feel picture is concerning for ovarian neoplasm.  Attending Dr. Jerrol discussed this with patient and family member in room who expressed understanding.  Prior to discharge I went spoke to family and patient again and answered further questions.  She is tolerating oral intake and she would like to go home.  OB/GYN will call to schedule appointment but I have also attached follow-up to discharge paperwork as well.  She was given return precautions. I ordered medication including normal saline, Zofran, Reglan Reevaluation of the  patient after these medicines showed that the patient stayed the same I have reviewed the patients home medicines and have made adjustments as needed   Plan  F/u w/ PCP in 2-3d to ensure resolution of sx.  Patient was given return precautions. Patient stable for discharge at this time.  Patient educated on sx/dx and verbalized understanding of plan. Return to ER w/ new or worsening sx.       Final diagnoses:  Ovarian mass, left    ED Discharge Orders          Ordered    ondansetron (ZOFRAN-ODT) 4 MG disintegrating  tablet  Every 8 hours PRN,   Status:  Discontinued        05/15/24 2023    metoCLOPramide (REGLAN) 10 MG tablet  Every 6 hours,   Status:  Discontinued        05/15/24 2023    ondansetron (ZOFRAN-ODT) 4 MG disintegrating tablet  Every 8 hours PRN        05/15/24 2043    metoCLOPramide (REGLAN) 10 MG tablet  Every 6 hours        05/15/24 2043               Bosco Paparella, Warren SAILOR, PA-C 05/15/24 2131    Jerrol Agent, MD 05/15/24 2249

## 2024-05-15 NOTE — Discharge Instructions (Addendum)
 You should be receiving a call from [facility] within 24 hours to schedule follow-up for today's ED visit.  If you have not heard back from them please call the number listed on your discharge paperwork.  For pain control I would alternate Tylenol  and ibuprofen.  For nausea I would recommend Zofran and if you have breakthrough nausea you can try the Reglan.  Make sure you are staying well-hydrated with water.  If you are no longer tolerating oral intake please return to ER.

## 2024-05-16 ENCOUNTER — Encounter: Payer: Self-pay | Admitting: Family Medicine

## 2024-05-16 ENCOUNTER — Other Ambulatory Visit (HOSPITAL_COMMUNITY)
Admission: RE | Admit: 2024-05-16 | Discharge: 2024-05-16 | Disposition: A | Discharge status: Home / Self Care | Source: Ambulatory Visit | Attending: Family Medicine | Admitting: Family Medicine

## 2024-05-16 ENCOUNTER — Encounter: Payer: Self-pay | Admitting: Gynecologic Oncology

## 2024-05-16 ENCOUNTER — Ambulatory Visit: Admitting: Family Medicine

## 2024-05-16 ENCOUNTER — Telehealth: Payer: Self-pay

## 2024-05-16 VITALS — BP 121/65 | HR 83 | Wt 137.0 lb

## 2024-05-16 DIAGNOSIS — R9389 Abnormal findings on diagnostic imaging of other specified body structures: Secondary | ICD-10-CM | POA: Insufficient documentation

## 2024-05-16 DIAGNOSIS — N83299 Other ovarian cyst, unspecified side: Secondary | ICD-10-CM

## 2024-05-16 NOTE — Telephone Encounter (Signed)
 Spoke with the patient regarding the referral to GYN oncology. Patient scheduled as new patient with Dr Viktoria on 05/17/2024. Patient given an arrival time of 8:30am.  Explained to the patient the the doctor will perform a pelvic exam at this visit. Patient given the policy that only one visitor allowed and that visitor must be over 16 yrs are allowed in the Cancer Center. Patient given the address/phone number for the clinic and that the center offers free valet service. Patient aware that masks required.

## 2024-05-16 NOTE — Progress Notes (Signed)
   Subjective:    Patient ID: Joann Carpenter, female    DOB: 05-04-1961, 63 y.o.   MRN: [id number]  HPI  Patient seen today due to abnormal imaging in the emergency department.  She was referred to us  by the emergency department.  Patient had onset of abdominal pain starting Sunday and gradually progressed with her developing nausea and vomiting.  She was found to have large cystic mass in the pelvis and right adnexa.  This was measuring 9 cm x 8 cm and 6.5 x 4.8 cm.  She did have a thickened endometrium with cystic components on ultrasound.  GYN oncology was consulted.  Patient has an appointment with them tomorrow but was referred to us  for Pap smear, endometrial biopsy, and CA125 labs.  Review of Systems     Objective:   Physical Exam Vitals reviewed. Exam conducted with a chaperone present.  Constitutional:      Appearance: Normal appearance.  Abdominal:     Hernia: There is no hernia in the left inguinal area or right inguinal area.  Genitourinary:    Labia:        Right: No rash, tenderness or lesion.        Left: No rash, tenderness or lesion.      Comments: Atrophic vaginal mucosa.  Cervix appears normal. PAP obtained. Lymphadenopathy:     Lower Body: No right inguinal adenopathy. No left inguinal adenopathy.   Skin:    General: Skin is warm.   Neurological:     General: No focal deficit present.     Mental Status: She is alert.   Psychiatric:        Mood and Affect: Mood normal.        Behavior: Behavior normal.        Thought Content: Thought content normal.        Judgment: Judgment normal.    ENDOMETRIAL BIOPSY     The indications for endometrial biopsy were reviewed.   Risks of the biopsy including cramping, bleeding, infection, uterine perforation, inadequate specimen and need for additional procedures  were discussed. The patient states she understands and agrees to undergo procedure today. Consent was signed. Time out was performed. Urine HCG was negative. A  sterile speculum was placed in the patient's vagina and the cervix was prepped with Betadine. A single-toothed tenaculum was placed on the anterior lip of the cervix to stabilize it. The 3 mm pipelle was introduced into the endometrial cavity without difficulty to a depth of 7 cm, and a moderate amount of tissue was obtained and sent to pathology. The instruments were removed from the patient's vagina. Minimal bleeding from the cervix was noted. The patient tolerated the procedure well. Routine post-procedure instructions were given to the patient. The patient will follow up to review the results and for further management.         Assessment & Plan:  1. Ovarian cyst, complex (Primary) Follow up with Gyn/Onc tomorrow. - Cytology - PAP( [place]) - CA 125  2. Endometrial thickening on ultrasound Biopsy obtained. Ibuprofen/tylenol  for cramping afterwards.  - Cytology - PAP( [place]) - CA 125 - Surgical pathology( [place]/ [facility])

## 2024-05-16 NOTE — H&P (View-Only) (Signed)
 GYNECOLOGIC ONCOLOGY NEW PATIENT CONSULTATION   Patient Name: Joann Carpenter  Patient Age: 63 y.o. Date of Service: 05/17/24 Referring Provider: Winton Felt, MD  Primary Care Provider: Aletha Bene, MD Consulting Provider: Comer Dollar, MD   Assessment/Plan:  Postmenopausal patient with complex adnexal mass, thickened endometrium.  Reviewed workup to date with the patient including recent imaging.  We looked at CT images together.  She has been quite symptomatic recently although improved since her visit to the emergency department.  We discussed her thickened endometrium.  She denies any postmenopausal bleeding however underwent endometrial biopsy with OB/GYN yesterday.  Discussed that thickened endometrium may be related to her adnexal mass or unrelated.  If biopsy results show EIN or malignancy, I will call to discuss any change in surgery that is necessary.  With regard to her adnexal mass, there is a multiseptated mostly cystic mass that appears to be arising from the right adnexa.  There are some features that raise the concern for possibility of borderline tumor or malignancy on ultrasound.  Although overall my suspicion is that this is a benign lesion.  On CT scan, there is no evidence of metastatic disease.  Given size and appearance of the masses as well as her symptoms, I recommend proceeding with diagnostic and therapeutic surgery.  CA125 was drawn yesterday at her visit.  Plan to get additional tumor markers including inhibin B given her thickened endometrial lining.  We discussed the plan for robotic surgery.  Plan to send the adnexal mass for frozen section.  If benign, patient would like to proceed with concurrent total hysterectomy.  She is understanding that endometrial biopsy results from yesterday may change or add to the planned procedures.  If adnexal mass is a borderline tumor, discussed additional staging procedures including peritoneal biopsies and omentectomy.   If the adnexal mass represents an ovarian malignancy, discussed possible addition of lymphadenectomy.  We reviewed the plan for a robotic assisted hysterectomy, bilateral salpingo-oophorectomy, possible staging, possible laparotomy. The risks of surgery were discussed in detail and she understands these to include infection; wound separation; hernia; vaginal cuff separation, injury to adjacent organs such as bowel, bladder, blood vessels, ureters and nerves; bleeding which may require blood transfusion; anesthesia risk; thromboembolic events; possible death; unforeseen complications; possible need for re-exploration; medical complications such as heart attack, stroke, pleural effusion and pneumonia; and, if full lymphadenectomy is performed the risk of lymphedema and lymphocyst. The patient will receive DVT and antibiotic prophylaxis as indicated. She voiced a clear understanding. She had the opportunity to ask questions. Perioperative instructions were reviewed with her. Prescriptions for post-op medications were sent to her pharmacy of choice.  In terms of her diabetes, we discussed the importance of good glycemic control in the perioperative period both to decrease the risk of major cardiac events but also postoperative morbidity.  We will hold her Ozempic  in the immediate preoperative period.  Hemoglobin A1c will be obtained with her preoperative labs.  Patient endorses significant history of constipation.  She has large stool burden on her CT scan.  Discussed the importance of good bowel regimen prior to surgery.  A copy of this note was sent to the patient's referring provider.   70 minutes of total time was spent for this patient encounter, including preparation, face-to-face counseling with the patient and coordination of care, and documentation of the encounter.  Comer Dollar, MD  Division of Gynecologic Oncology  Department of Obstetrics and Gynecology  [facility]  ___________________________________________  Chief Complaint: Chief Complaint  Patient presents with   Adnexal mass    History of Present Illness:  Joann Carpenter is a 63 y.o. y.o. female who is seen in consultation at the request of Dr. Alger for an evaluation of a complex adnexal mass, thickened endometrium.  Patient had onset of abdominal pain starting 6/22.  Given worsening pain and development of nausea and emesis, she presented to the emergency department on 6/25.  CT of the abdomen and pelvis showed large cystic masses centrally in the pelvis, larger measuring 9.1 x 8.1 cm and right adnexal cyst measuring 6.5 x 4.8 cm.  Both presumably arising from the right ovary.  No free fluid noted.  No adenopathy or evidence of metastatic disease.  Pelvic ultrasound, uterus measures 7.1 x 2.4 x 4.7 cm.  Endometrial lining measures 15.8 mm and is noted to be heterogenous with cystic components.  Right ovary measures 2.6 x 1.3 x 2.8 cm.  3 right adnexal masses are identified.  1 measures up to 6.2 cm and noted to be complex cystic with diffuse low-level echoes.  A solid appearing mass with internal cystic structures measures up to 5.9 cm, no internal vascularity noted.  Third mass which is described as heterogenous solid without internal vascular flow measures up to 7.8 cm.  Left ovary is normal in appearance without masses.  No free fluid.    The patient saw Dr. Barbra on 6/26 and underwent Pap test, endometrial biopsy, and CA125.  These results are all pending.  Today, the patient presents with her husband.  She notes several months of intermittent nausea, sometimes would have dry heaving or emesis.  Last Sunday, she developed gas/cramping pain in her lower abdomen.  It felt like it radiated sometimes to her rectum.  Over the next couple of days, pain worsened.  She was having trouble keeping anything down.  Since being seen in the emergency department, notes pain is still present but better.   Denies having a bowel movement in the last 7-8 days.  Having some flatus.  Notes 3 months of decreased appetite which she attributed to Ozempic .  She started Ozempic  about a year ago.  She is lost 43 pounds overall, thinks about 8-10 in the last 3 months.  She denies any urinary symptoms.  She has type 2 diabetes.  Blood sugars generally run under 150.  PAST MEDICAL HISTORY:  Past Medical History:  Diagnosis Date   Anxiety    Depression    Diabetes mellitus without complication (HCC)    Hypocholesteremia      PAST SURGICAL HISTORY:  Past Surgical History:  Procedure Laterality Date   ROOT CANAL      OB/GYN HISTORY:  OB History  Gravida Para Term Preterm AB Living  2 2 2   1   SAB IAB Ectopic Multiple Live Births      1    # Outcome Date GA Lbr Len/2nd Weight Sex Type Anes PTL Lv  2 Term 50 [redacted]w[redacted]d   M Vag-Spont None N LIV  1 Term 37 [redacted]w[redacted]d   M Vag-Spont None N     No LMP recorded. Patient is postmenopausal.  Age at menarche: 79  Age at menopause: 68 Hx of HRT: denies Hx of STDs: denies Last pap: yesterday, prior had been years History of abnormal pap smears: History of an abnormal Pap smear that she thinks required biopsies but no additional treatment  SCREENING STUDIES:  Last mammogram: 2025  Last colonoscopy: 2025  MEDICATIONS: Outpatient  Encounter Medications as of 05/17/2024  Medication Sig   glucose blood (CONTOUR NEXT TEST) test strip USE TO TEST BLOOD SUGAR EVERY DAY   glucose blood (CONTOUR NEXT TEST) test strip USE TO TEST BLOOD SUGAR EVERY DAY   Lancets MISC Use 1 lancet to check blood sugar daily.   lisinopril (ZESTRIL) 5 MG tablet Take 5 mg by mouth daily.   meloxicam (MOBIC) 15 MG tablet Take 1 tablet by mouth daily.   metoCLOPramide  (REGLAN ) 10 MG tablet Take 0.5 tablets (5 mg total) by mouth every 6 (six) hours.   ondansetron  (ZOFRAN -ODT) 4 MG disintegrating tablet Take 1 tablet (4 mg total) by mouth every 8 (eight) hours as needed for nausea or  vomiting.   OZEMPIC , 1 MG/DOSE, 4 MG/3ML SOPN INJECT 1  MG INTO THE SKIN  ONCE A WEEK   pioglitazone (ACTOS) 30 MG tablet Take 1 tablet by mouth daily.   simvastatin (ZOCOR) 40 MG tablet Take 40 mg by mouth at bedtime.   traZODone  (DESYREL ) 50 MG tablet Take 1 tablet (50 mg total) by mouth at bedtime.   lidocaine (XYLOCAINE) 1 % (with preservative) injection by Infiltration route. (Patient not taking: Reported on 05/16/2024)   metFORMIN (GLUCOPHAGE-XR) 500 MG 24 hr tablet Take by mouth. (Patient not taking: Reported on 05/16/2024)   terbinafine (LAMISIL) 250 MG tablet Take 250 mg by mouth daily. (Patient not taking: Reported on 05/16/2024)   triamcinolone acetonide (KENALOG-40) 40 MG/ML injection Inject into the articular space. (Patient not taking: Reported on 05/16/2024)   zolpidem (AMBIEN) 10 MG tablet Take by mouth. (Patient not taking: Reported on 05/16/2024)   No facility-administered encounter medications on file as of 05/17/2024.    ALLERGIES:  Allergies  Allergen Reactions   Codeine Hives     FAMILY HISTORY:  Family History  Problem Relation Age of Onset   Cancer Father        unsure type   Ovarian cancer Maternal Grandmother    Colon cancer Neg Hx    Breast cancer Neg Hx    Endometrial cancer Neg Hx    Pancreatic cancer Neg Hx    Prostate cancer Neg Hx      SOCIAL HISTORY:  Social Connections: Not on file    REVIEW OF SYSTEMS:  + nausea Denies appetite changes, fevers, chills, fatigue, unexplained weight changes. Denies hearing loss, neck lumps or masses, mouth sores, ringing in ears or voice changes. Denies cough or wheezing.  Denies shortness of breath. Denies chest pain or palpitations. Denies leg swelling. Denies abdominal distention, pain, blood in stools, constipation, diarrhea, vomiting, or early satiety. Denies pain with intercourse, dysuria, frequency, hematuria or incontinence. Denies hot flashes, pelvic pain, vaginal bleeding or vaginal discharge.   Denies  joint pain, back pain or muscle pain/cramps. Denies itching, rash, or wounds. Denies dizziness, headaches, numbness or seizures. Denies swollen lymph nodes or glands, denies easy bruising or bleeding. Denies anxiety, depression, confusion, or decreased concentration.  Physical Exam:  Vital Signs for this encounter:  Blood pressure 116/60, pulse 65, temperature 97.8 F (36.6 C), temperature source Oral, resp. rate 16, height 5' 2 (1.575 m), weight 136 lb 6.4 oz (61.9 kg), SpO2 100%. Body mass index is 24.95 kg/m. General: Alert, oriented, no acute distress.  HEENT: Normocephalic, atraumatic. Sclera anicteric.  Chest: Clear to auscultation bilaterally. No wheezes, rhonchi, or rales. Cardiovascular: Regular rate and rhythm, no murmurs, rubs, or gallops.  Abdomen: Normoactive bowel sounds. Soft, nondistended, mild tenderness to palpation in lower abdomen. No masses  or hepatosplenomegaly appreciated. No palpable fluid wave.  Extremities: Grossly normal range of motion. Warm, well perfused. No edema bilaterally.  Skin: No rashes or lesions.  Lymphatics: No cervical, supraclavicular, or inguinal adenopathy.  GU:  Normal external female genitalia. No lesions. No discharge or bleeding.             Bladder/urethra:  No lesions or masses, well supported bladder             Vagina: Well-rugated, mildly atrophic, no lesions.             Cervix: Normal appearing, no lesions.             Uterus: Small, mobile, no parametrial involvement or nodularity.  Uterus somewhat deviated to the left.             Adnexa: Mass fills the posterior cul-de-sac, more on the right, smooth, no nodularity, mass approximately 10-12 cm, moderately mobile.  Rectal: Deferred.  LABORATORY AND RADIOLOGIC DATA:  Outside medical records were reviewed to synthesize the above history, along with the history and physical obtained during the visit.   Lab Results  Component Value Date   WBC 18.9 (H) 05/15/2024   HGB 14.9  05/15/2024   HCT 42.4 05/15/2024   PLT 347 05/15/2024   GLUCOSE 143 (H) 05/15/2024   ALT 21 05/15/2024   AST 20 05/15/2024   NA 139 05/15/2024   K 4.2 05/15/2024   CL 101 05/15/2024   CREATININE 0.95 05/15/2024   BUN 16 05/15/2024   CO2 21 (L) 05/15/2024

## 2024-05-16 NOTE — Progress Notes (Signed)
 GYNECOLOGIC ONCOLOGY NEW PATIENT CONSULTATION   Patient Name: Joann Carpenter  Patient Age: 63 y.o. Date of Service: 05/17/24 Referring Provider: Winton Felt, MD  Primary Care Provider: Aletha Bene, MD Consulting Provider: Comer Dollar, MD   Assessment/Plan:  Postmenopausal patient with complex adnexal mass, thickened endometrium.  Reviewed workup to date with the patient including recent imaging.  We looked at CT images together.  She has been quite symptomatic recently although improved since her visit to the emergency department.  We discussed her thickened endometrium.  She denies any postmenopausal bleeding however underwent endometrial biopsy with OB/GYN yesterday.  Discussed that thickened endometrium may be related to her adnexal mass or unrelated.  If biopsy results show EIN or malignancy, I will call to discuss any change in surgery that is necessary.  With regard to her adnexal mass, there is a multiseptated mostly cystic mass that appears to be arising from the right adnexa.  There are some features that raise the concern for possibility of borderline tumor or malignancy on ultrasound.  Although overall my suspicion is that this is a benign lesion.  On CT scan, there is no evidence of metastatic disease.  Given size and appearance of the masses as well as her symptoms, I recommend proceeding with diagnostic and therapeutic surgery.  CA125 was drawn yesterday at her visit.  Plan to get additional tumor markers including inhibin B given her thickened endometrial lining.  We discussed the plan for robotic surgery.  Plan to send the adnexal mass for frozen section.  If benign, patient would like to proceed with concurrent total hysterectomy.  She is understanding that endometrial biopsy results from yesterday may change or add to the planned procedures.  If adnexal mass is a borderline tumor, discussed additional staging procedures including peritoneal biopsies and omentectomy.   If the adnexal mass represents an ovarian malignancy, discussed possible addition of lymphadenectomy.  We reviewed the plan for a robotic assisted hysterectomy, bilateral salpingo-oophorectomy, possible staging, possible laparotomy. The risks of surgery were discussed in detail and she understands these to include infection; wound separation; hernia; vaginal cuff separation, injury to adjacent organs such as bowel, bladder, blood vessels, ureters and nerves; bleeding which may require blood transfusion; anesthesia risk; thromboembolic events; possible death; unforeseen complications; possible need for re-exploration; medical complications such as heart attack, stroke, pleural effusion and pneumonia; and, if full lymphadenectomy is performed the risk of lymphedema and lymphocyst. The patient will receive DVT and antibiotic prophylaxis as indicated. She voiced a clear understanding. She had the opportunity to ask questions. Perioperative instructions were reviewed with her. Prescriptions for post-op medications were sent to her pharmacy of choice.  In terms of her diabetes, we discussed the importance of good glycemic control in the perioperative period both to decrease the risk of major cardiac events but also postoperative morbidity.  We will hold her Ozempic  in the immediate preoperative period.  Hemoglobin A1c will be obtained with her preoperative labs.  Patient endorses significant history of constipation.  She has large stool burden on her CT scan.  Discussed the importance of good bowel regimen prior to surgery.  A copy of this note was sent to the patient's referring provider.   70 minutes of total time was spent for this patient encounter, including preparation, face-to-face counseling with the patient and coordination of care, and documentation of the encounter.  Comer Dollar, MD  Division of Gynecologic Oncology  Department of Obstetrics and Gynecology  [facility]  ___________________________________________  Chief Complaint: Chief Complaint  Patient presents with   Adnexal mass    History of Present Illness:  Joann Carpenter is a 63 y.o. y.o. female who is seen in consultation at the request of Dr. Alger for an evaluation of a complex adnexal mass, thickened endometrium.  Patient had onset of abdominal pain starting 6/22.  Given worsening pain and development of nausea and emesis, she presented to the emergency department on 6/25.  CT of the abdomen and pelvis showed large cystic masses centrally in the pelvis, larger measuring 9.1 x 8.1 cm and right adnexal cyst measuring 6.5 x 4.8 cm.  Both presumably arising from the right ovary.  No free fluid noted.  No adenopathy or evidence of metastatic disease.  Pelvic ultrasound, uterus measures 7.1 x 2.4 x 4.7 cm.  Endometrial lining measures 15.8 mm and is noted to be heterogenous with cystic components.  Right ovary measures 2.6 x 1.3 x 2.8 cm.  3 right adnexal masses are identified.  1 measures up to 6.2 cm and noted to be complex cystic with diffuse low-level echoes.  A solid appearing mass with internal cystic structures measures up to 5.9 cm, no internal vascularity noted.  Third mass which is described as heterogenous solid without internal vascular flow measures up to 7.8 cm.  Left ovary is normal in appearance without masses.  No free fluid.    The patient saw Dr. Barbra on 6/26 and underwent Pap test, endometrial biopsy, and CA125.  These results are all pending.  Today, the patient presents with her husband.  She notes several months of intermittent nausea, sometimes would have dry heaving or emesis.  Last Sunday, she developed gas/cramping pain in her lower abdomen.  It felt like it radiated sometimes to her rectum.  Over the next couple of days, pain worsened.  She was having trouble keeping anything down.  Since being seen in the emergency department, notes pain is still present but better.   Denies having a bowel movement in the last 7-8 days.  Having some flatus.  Notes 3 months of decreased appetite which she attributed to Ozempic .  She started Ozempic  about a year ago.  She is lost 43 pounds overall, thinks about 8-10 in the last 3 months.  She denies any urinary symptoms.  She has type 2 diabetes.  Blood sugars generally run under 150.  PAST MEDICAL HISTORY:  Past Medical History:  Diagnosis Date   Anxiety    Depression    Diabetes mellitus without complication (HCC)    Hypocholesteremia      PAST SURGICAL HISTORY:  Past Surgical History:  Procedure Laterality Date   ROOT CANAL      OB/GYN HISTORY:  OB History  Gravida Para Term Preterm AB Living  2 2 2   1   SAB IAB Ectopic Multiple Live Births      1    # Outcome Date GA Lbr Len/2nd Weight Sex Type Anes PTL Lv  2 Term 50 [redacted]w[redacted]d   M Vag-Spont None N LIV  1 Term 37 [redacted]w[redacted]d   M Vag-Spont None N     No LMP recorded. Patient is postmenopausal.  Age at menarche: 79  Age at menopause: 68 Hx of HRT: denies Hx of STDs: denies Last pap: yesterday, prior had been years History of abnormal pap smears: History of an abnormal Pap smear that she thinks required biopsies but no additional treatment  SCREENING STUDIES:  Last mammogram: 2025  Last colonoscopy: 2025  MEDICATIONS: Outpatient  Encounter Medications as of 05/17/2024  Medication Sig   glucose blood (CONTOUR NEXT TEST) test strip USE TO TEST BLOOD SUGAR EVERY DAY   glucose blood (CONTOUR NEXT TEST) test strip USE TO TEST BLOOD SUGAR EVERY DAY   Lancets MISC Use 1 lancet to check blood sugar daily.   lisinopril (ZESTRIL) 5 MG tablet Take 5 mg by mouth daily.   meloxicam (MOBIC) 15 MG tablet Take 1 tablet by mouth daily.   metoCLOPramide  (REGLAN ) 10 MG tablet Take 0.5 tablets (5 mg total) by mouth every 6 (six) hours.   ondansetron  (ZOFRAN -ODT) 4 MG disintegrating tablet Take 1 tablet (4 mg total) by mouth every 8 (eight) hours as needed for nausea or  vomiting.   OZEMPIC , 1 MG/DOSE, 4 MG/3ML SOPN INJECT 1  MG INTO THE SKIN  ONCE A WEEK   pioglitazone (ACTOS) 30 MG tablet Take 1 tablet by mouth daily.   simvastatin (ZOCOR) 40 MG tablet Take 40 mg by mouth at bedtime.   traZODone  (DESYREL ) 50 MG tablet Take 1 tablet (50 mg total) by mouth at bedtime.   lidocaine (XYLOCAINE) 1 % (with preservative) injection by Infiltration route. (Patient not taking: Reported on 05/16/2024)   metFORMIN (GLUCOPHAGE-XR) 500 MG 24 hr tablet Take by mouth. (Patient not taking: Reported on 05/16/2024)   terbinafine (LAMISIL) 250 MG tablet Take 250 mg by mouth daily. (Patient not taking: Reported on 05/16/2024)   triamcinolone acetonide (KENALOG-40) 40 MG/ML injection Inject into the articular space. (Patient not taking: Reported on 05/16/2024)   zolpidem (AMBIEN) 10 MG tablet Take by mouth. (Patient not taking: Reported on 05/16/2024)   No facility-administered encounter medications on file as of 05/17/2024.    ALLERGIES:  Allergies  Allergen Reactions   Codeine Hives     FAMILY HISTORY:  Family History  Problem Relation Age of Onset   Cancer Father        unsure type   Ovarian cancer Maternal Grandmother    Colon cancer Neg Hx    Breast cancer Neg Hx    Endometrial cancer Neg Hx    Pancreatic cancer Neg Hx    Prostate cancer Neg Hx      SOCIAL HISTORY:  Social Connections: Not on file    REVIEW OF SYSTEMS:  + nausea Denies appetite changes, fevers, chills, fatigue, unexplained weight changes. Denies hearing loss, neck lumps or masses, mouth sores, ringing in ears or voice changes. Denies cough or wheezing.  Denies shortness of breath. Denies chest pain or palpitations. Denies leg swelling. Denies abdominal distention, pain, blood in stools, constipation, diarrhea, vomiting, or early satiety. Denies pain with intercourse, dysuria, frequency, hematuria or incontinence. Denies hot flashes, pelvic pain, vaginal bleeding or vaginal discharge.   Denies  joint pain, back pain or muscle pain/cramps. Denies itching, rash, or wounds. Denies dizziness, headaches, numbness or seizures. Denies swollen lymph nodes or glands, denies easy bruising or bleeding. Denies anxiety, depression, confusion, or decreased concentration.  Physical Exam:  Vital Signs for this encounter:  Blood pressure 116/60, pulse 65, temperature 97.8 F (36.6 C), temperature source Oral, resp. rate 16, height 5' 2 (1.575 m), weight 136 lb 6.4 oz (61.9 kg), SpO2 100%. Body mass index is 24.95 kg/m. General: Alert, oriented, no acute distress.  HEENT: Normocephalic, atraumatic. Sclera anicteric.  Chest: Clear to auscultation bilaterally. No wheezes, rhonchi, or rales. Cardiovascular: Regular rate and rhythm, no murmurs, rubs, or gallops.  Abdomen: Normoactive bowel sounds. Soft, nondistended, mild tenderness to palpation in lower abdomen. No masses  or hepatosplenomegaly appreciated. No palpable fluid wave.  Extremities: Grossly normal range of motion. Warm, well perfused. No edema bilaterally.  Skin: No rashes or lesions.  Lymphatics: No cervical, supraclavicular, or inguinal adenopathy.  GU:  Normal external female genitalia. No lesions. No discharge or bleeding.             Bladder/urethra:  No lesions or masses, well supported bladder             Vagina: Well-rugated, mildly atrophic, no lesions.             Cervix: Normal appearing, no lesions.             Uterus: Small, mobile, no parametrial involvement or nodularity.  Uterus somewhat deviated to the left.             Adnexa: Mass fills the posterior cul-de-sac, more on the right, smooth, no nodularity, mass approximately 10-12 cm, moderately mobile.  Rectal: Deferred.  LABORATORY AND RADIOLOGIC DATA:  Outside medical records were reviewed to synthesize the above history, along with the history and physical obtained during the visit.   Lab Results  Component Value Date   WBC 18.9 (H) 05/15/2024   HGB 14.9  05/15/2024   HCT 42.4 05/15/2024   PLT 347 05/15/2024   GLUCOSE 143 (H) 05/15/2024   ALT 21 05/15/2024   AST 20 05/15/2024   NA 139 05/15/2024   K 4.2 05/15/2024   CL 101 05/15/2024   CREATININE 0.95 05/15/2024   BUN 16 05/15/2024   CO2 21 (L) 05/15/2024

## 2024-05-17 ENCOUNTER — Inpatient Hospital Stay: Attending: Gynecologic Oncology | Admitting: Gynecologic Oncology

## 2024-05-17 ENCOUNTER — Inpatient Hospital Stay: Admitting: Gynecologic Oncology

## 2024-05-17 ENCOUNTER — Ambulatory Visit: Payer: Self-pay | Admitting: Family Medicine

## 2024-05-17 ENCOUNTER — Encounter: Payer: Self-pay | Admitting: Gynecologic Oncology

## 2024-05-17 ENCOUNTER — Ambulatory Visit: Payer: Self-pay | Admitting: Gynecologic Oncology

## 2024-05-17 ENCOUNTER — Inpatient Hospital Stay

## 2024-05-17 ENCOUNTER — Telehealth: Payer: Self-pay

## 2024-05-17 VITALS — BP 116/60 | HR 65 | Temp 97.8°F | Resp 16 | Ht 62.0 in | Wt 136.4 lb

## 2024-05-17 DIAGNOSIS — R112 Nausea with vomiting, unspecified: Secondary | ICD-10-CM | POA: Insufficient documentation

## 2024-05-17 DIAGNOSIS — Z7984 Long term (current) use of oral hypoglycemic drugs: Secondary | ICD-10-CM | POA: Diagnosis not present

## 2024-05-17 DIAGNOSIS — F419 Anxiety disorder, unspecified: Secondary | ICD-10-CM | POA: Diagnosis not present

## 2024-05-17 DIAGNOSIS — R634 Abnormal weight loss: Secondary | ICD-10-CM

## 2024-05-17 DIAGNOSIS — R971 Elevated cancer antigen 125 [CA 125]: Secondary | ICD-10-CM | POA: Diagnosis not present

## 2024-05-17 DIAGNOSIS — D390 Neoplasm of uncertain behavior of uterus: Secondary | ICD-10-CM | POA: Insufficient documentation

## 2024-05-17 DIAGNOSIS — E7800 Pure hypercholesterolemia, unspecified: Secondary | ICD-10-CM | POA: Diagnosis not present

## 2024-05-17 DIAGNOSIS — Z7985 Long-term (current) use of injectable non-insulin antidiabetic drugs: Secondary | ICD-10-CM | POA: Diagnosis not present

## 2024-05-17 DIAGNOSIS — R9389 Abnormal findings on diagnostic imaging of other specified body structures: Secondary | ICD-10-CM | POA: Diagnosis present

## 2024-05-17 DIAGNOSIS — E119 Type 2 diabetes mellitus without complications: Secondary | ICD-10-CM | POA: Insufficient documentation

## 2024-05-17 DIAGNOSIS — Z79899 Other long term (current) drug therapy: Secondary | ICD-10-CM | POA: Diagnosis not present

## 2024-05-17 DIAGNOSIS — Z791 Long term (current) use of non-steroidal anti-inflammatories (NSAID): Secondary | ICD-10-CM | POA: Diagnosis not present

## 2024-05-17 DIAGNOSIS — F32A Depression, unspecified: Secondary | ICD-10-CM | POA: Diagnosis not present

## 2024-05-17 DIAGNOSIS — K5900 Constipation, unspecified: Secondary | ICD-10-CM | POA: Insufficient documentation

## 2024-05-17 DIAGNOSIS — R1900 Intra-abdominal and pelvic swelling, mass and lump, unspecified site: Secondary | ICD-10-CM

## 2024-05-17 DIAGNOSIS — N9489 Other specified conditions associated with female genital organs and menstrual cycle: Secondary | ICD-10-CM

## 2024-05-17 DIAGNOSIS — E1165 Type 2 diabetes mellitus with hyperglycemia: Secondary | ICD-10-CM

## 2024-05-17 LAB — HEMOGLOBIN A1C
Hgb A1c MFr Bld: 5.5 % (ref 4.8–5.6)
Mean Plasma Glucose: 111.15 mg/dL

## 2024-05-17 LAB — CA 125: Cancer Antigen (CA) 125: 65.2 U/mL — ABNORMAL HIGH (ref 0.0–38.1)

## 2024-05-17 LAB — CEA (ACCESS): CEA (CHCC): 1.01 ng/mL (ref 0.00–5.00)

## 2024-05-17 NOTE — Telephone Encounter (Signed)
 Joann Carpenter from [facility] called requesting a rush on the EMB. Patient was seen in our office late yesterday afternoon and the specimen has not been picked up yet.   Spoke to Cydney in pathology, Cydney states the specimen will likely be received today and processed on Monday. She states the Pathologist will review the specimen on Tuesday.  Chastin Garlitz l Mervin Ramires, CMA

## 2024-05-17 NOTE — Patient Instructions (Addendum)
 Preparing for your Surgery  Plan for surgery on 05/23/2024 with Dr. Comer Dollar at [facility]. You will be scheduled for a robotic assisted total laparoscopic hysterectomy (removal of uterus and cervix), bilateral salpingo-oophorectomy (removal of both fallopian tubes and ovaries), possible sentinel lymph node biopsy, possible lymph node dissection, possible staging.  Pre-operative Testing -You will receive a phone call from presurgical testing at [facility] to arrange for a pre-operative appointment and lab work.  -Bring your insurance card, copy of an advanced directive if applicable, medication list  -At that visit, you will be asked to sign a consent for a possible blood transfusion in case a transfusion becomes necessary during surgery.  The need for a blood transfusion is rare but having consent is a necessary part of your care.     -You should not be taking blood thinners or aspirin at least ten days prior to surgery unless instructed by your surgeon.  - Please hold your Ozempic  until after surgery.  -Do not take supplements such as fish oil (omega 3), red yeast rice, turmeric before your surgery. STOP TAKING AT LEAST 10 DAYS BEFORE SURGERY. You want to avoid medications with aspirin in them including headache powders such as BC or Goody's), Excedrin migraine.  Day Before Surgery at Home -You will be asked to take in a light diet the day before surgery. You will be advised you can have clear liquids up until 3 hours before your surgery.    Eat a light diet the day before surgery.  Examples including soups, broths, toast, yogurt, mashed potatoes.  AVOID GAS PRODUCING FOODS AND BEVERAGES. Things to avoid include carbonated beverages (fizzy beverages, sodas), raw fruits and raw vegetables (uncooked), or beans.   If your bowels are filled with gas, your surgeon will have difficulty visualizing your pelvic organs which increases your surgical risks.  Your role in  recovery Your role is to become active as soon as directed by your doctor, while still giving yourself time to heal.  Rest when you feel tired. You will be asked to do the following in order to speed your recovery:  - Cough and breathe deeply. This helps to clear and expand your lungs and can prevent pneumonia after surgery.  - STAY ACTIVE WHEN YOU GET HOME. Do mild physical activity. Walking or moving your legs help your circulation and body functions return to normal. Do not try to get up or walk alone the first time after surgery.   -If you develop swelling on one leg or the other, pain in the back of your leg, redness/warmth in one of your legs, please call the office or go to the Emergency Room to have a doppler to rule out a blood clot. For shortness of breath, chest pain-seek care in the Emergency Room as soon as possible. - Actively manage your pain. Managing your pain lets you move in comfort. We will ask you to rate your pain on a scale of zero to 10. It is your responsibility to tell your doctor or nurse where and how much you hurt so your pain can be treated.  Special Considerations -If you are diabetic, you may be placed on insulin after surgery to have closer control over your blood sugars to promote healing and recovery.  This does not mean that you will be discharged on insulin.  If applicable, your oral antidiabetics will be resumed when you are tolerating a solid diet.  -Your final pathology results from surgery should be  available around one week after surgery and the results will be relayed to you when available.  -Dr. Olam Mill is the surgeon that assists your GYN Oncologist with surgery.  If you end up staying the night, the next day after your surgery you will either see Dr. Viktoria, Dr. Eldonna, or Dr. Olam Mill.  -FMLA forms can be faxed to [phone] and please allow 5-7 business days for completion.  Pain Management After Surgery -You will be prescribed  your pain medication and bowel regimen medications before surgery so that you can have these available when you are discharged from the hospital. The pain medication is for use ONLY AFTER surgery and a new prescription will not be given.   -Make sure that you have Tylenol  and Ibuprofen IF YOU ARE ABLE TO TAKE THESE MEDICATIONS at home to use on a regular basis after surgery for pain control. We recommend alternating the medications every hour to six hours since they work differently and are processed in the body differently for pain relief.  -Review the attached handout on narcotic use and their risks and side effects.   Bowel Regimen -You will be prescribed Sennakot-S to take nightly to prevent constipation especially if you are taking the narcotic pain medication intermittently.  It is important to prevent constipation and drink adequate amounts of liquids. You can stop taking this medication when you are not taking pain medication and you are back on your normal bowel routine.  Risks of Surgery Risks of surgery are low but include bleeding, infection, damage to surrounding structures, re-operation, blood clots, and very rarely death.   Blood Transfusion Information (For the consent to be signed before surgery)  We will be checking your blood type before surgery so in case of emergencies, we will know what type of blood you would need.                                            WHAT IS A BLOOD TRANSFUSION?  A transfusion is the replacement of blood or some of its parts. Blood is made up of multiple cells which provide different functions. Red blood cells carry oxygen and are used for blood loss replacement. White blood cells fight against infection. Platelets control bleeding. Plasma helps clot blood. Other blood products are available for specialized needs, such as hemophilia or other clotting disorders. BEFORE THE TRANSFUSION  Who gives blood for transfusions?  You may be able to  donate blood to be used at a later date on yourself (autologous donation). Relatives can be asked to donate blood. This is generally not any safer than if you have received blood from a stranger. The same precautions are taken to ensure safety when a relative's blood is donated. Healthy volunteers who are fully evaluated to make sure their blood is safe. This is blood bank blood. Transfusion therapy is the safest it has ever been in the practice of medicine. Before blood is taken from a donor, a complete history is taken to make sure that person has no history of diseases nor engages in risky social behavior (examples are intravenous drug use or sexual activity with multiple partners). The donor's travel history is screened to minimize risk of transmitting infections, such as malaria. The donated blood is tested for signs of infectious diseases, such as HIV and hepatitis. The blood is then tested to be sure it  is compatible with you in order to minimize the chance of a transfusion reaction. If you or a relative donates blood, this is often done in anticipation of surgery and is not appropriate for emergency situations. It takes many days to process the donated blood. RISKS AND COMPLICATIONS Although transfusion therapy is very safe and saves many lives, the main dangers of transfusion include:  Getting an infectious disease. Developing a transfusion reaction. This is an allergic reaction to something in the blood you were given. Every precaution is taken to prevent this. The decision to have a blood transfusion has been considered carefully by your caregiver before blood is given. Blood is not given unless the benefits outweigh the risks.  AFTER SURGERY INSTRUCTIONS  Return to work: 4-6 weeks if applicable  Activity: 1. Be up and out of the bed during the day.  Take a nap if needed.  You may walk up steps but be careful and use the hand rail.  Stair climbing will tire you more than you think, you may  need to stop part way and rest.   2. No lifting or straining for 6 weeks over 10 pounds. No pushing, pulling, straining for 6 weeks.  3. No driving for 4-89 days when the following criteria have been met: Do not drive if you are taking narcotic pain medicine and make sure that your reaction time has returned.   4. You can shower as soon as the next day after surgery. Shower daily.  Use your regular soap and water (not directly on the incision) and pat your incision(s) dry afterwards; don't rub.  No tub baths or submerging your body in water until cleared by your surgeon. If you have the soap that was given to you by pre-surgical testing that was used before surgery, you do not need to use it afterwards because this can irritate your incisions.   5. No sexual activity and nothing in the vagina for 12 weeks.  6. You may experience a small amount of clear drainage from your incisions, which is normal.  If the drainage persists, increases, or changes color please call the office.  7. Do not use creams, lotions, or ointments such as neosporin on your incisions after surgery until advised by your surgeon because they can cause removal of the dermabond glue on your incisions.    8. You may experience vaginal spotting after surgery or when the stitches at the top of the vagina begin to dissolve.  The spotting is normal but if you experience heavy bleeding, call our office.  9. Take Tylenol  or ibuprofen first for pain if you are able to take these medications and only use narcotic pain medication for severe pain not relieved by the Tylenol  or Ibuprofen.  Monitor your Tylenol  intake to a max of 4,000 mg in a 24 hour period. You can alternate these medications after surgery.  Diet: 1. Low sodium Heart Healthy Diet is recommended but you are cleared to resume your normal (before surgery) diet after your procedure.  2. It is safe to use a laxative, such as Miralax or Colace, if you have difficulty moving  your bowels before surgery. You have been prescribed Sennakot-S to take at bedtime every evening after surgery to keep bowel movements regular and to prevent constipation.    Wound Care: 1. Keep clean and dry.  Shower daily.  Reasons to call the Doctor: Fever - Oral temperature greater than 100.4 degrees Fahrenheit Foul-smelling vaginal discharge Difficulty urinating Nausea and vomiting  Increased pain at the site of the incision that is unrelieved with pain medicine. Difficulty breathing with or without chest pain New calf pain especially if only on one side Sudden, continuing increased vaginal bleeding with or without clots.   Contacts: For questions or concerns you should contact:  Dr. Comer Dollar at [phone]  Eleanor Epps, NP at [phone]  After Hours: call [phone] and have the GYN Oncologist paged/contacted (after 5 pm or on the weekends). You will speak with an after hours RN and let he or she know you have had surgery.  Messages sent via mychart are for non-urgent matters and are not responded to after hours so for urgent needs, please call the after hours number.

## 2024-05-17 NOTE — Progress Notes (Signed)
 Patient here for a consult with Dr. Viktoria and for a pre-operative appointment prior to her scheduled surgery on 05/23/2024. She is scheduled for a robotic assisted total laparoscopic hysterectomy, bilateral salpingo-oophorectomy, possible sentinel lymph node biopsy, possible lymph node dissection, possible staging. The surgery was discussed in detail.  See after visit summary for additional details.      Discussed post-op pain management in detail including the aspects of the enhanced recovery pathway.  Advised her that a new prescription would be sent in for Tramadol and it is only to be used for after her upcoming surgery.  We discussed the use of tylenol  post-op and to monitor for a maximum of 4,000 mg in a 24 hour period.  Also prescribed sennakot to be used after surgery and to hold if having loose stools.  Discussed bowel regimen in detail.     Discussed the use of SCDs and measures to take at home to prevent DVT including frequent mobility.  Reportable signs and symptoms of DVT discussed. Post-operative instructions discussed and expectations for after surgery. Incisional care discussed as well including reportable signs and symptoms including erythema, drainage, wound separation.     30 minutes spent with the patient.  Verbalizing understanding of material discussed. No needs or concerns voiced at the end of the visit.   Advised patient to call for any needs.  Advised that her post-operative medications had been prescribed and could be picked up at any time.    This appointment is included in the global surgical bundle as pre-operative teaching and has no charge.

## 2024-05-18 LAB — CANCER ANTIGEN 19-9: CA 19-9: 6 U/mL (ref 0–35)

## 2024-05-18 NOTE — Patient Instructions (Signed)
 SURGICAL WAITING ROOM VISITATION Patients having surgery or a procedure may have no more than 2 support people in the waiting area - these visitors may rotate in the visitor waiting room.   If the patient needs to stay at the hospital during part of their recovery, the visitor guidelines for inpatient rooms apply.  PRE-OP VISITATION  Pre-op nurse will coordinate an appropriate time for 1 support person to accompany the patient in pre-op.  This support person may not rotate.  This visitor will be contacted when the time is appropriate for the visitor to come back in the pre-op area.  Please refer to the [facility] website for the visitor guidelines for Inpatients (after your surgery is over and you are in a regular room).  You are not required to quarantine at this time prior to your surgery. However, you must do this: Hand Hygiene often Do NOT share personal items Notify your provider if you are in close contact with someone who has COVID or you develop fever 100.4 or greater, new onset of sneezing, cough, sore throat, shortness of breath or body aches.  If you test positive for Covid or have been in contact with anyone that has tested positive in the last 10 days please notify you surgeon.    Your procedure is scheduled on:  THURSDAY  May 23, 2024  Report to [facility] Main Entrance: [address] entrance where the [employer] is available.   Report to admitting at:   05:15  AM  Call this number if you have any questions or problems the morning of surgery [phone]  FOLLOW ANY ADDITIONAL PRE OP INSTRUCTIONS YOU RECEIVED FROM YOUR SURGEON'S OFFICE!!!  Eat a light diet the day before surgery.  Examples including soups, broths, toast, yogurt, mashed potatoes.  AVOID GAS PRODUCING FOODS. Things to avoid include carbonated beverages (fizzy beverages, sodas), raw fruits and raw vegetables (uncooked), or beans.    Do not eat food after Midnight the night prior to your  surgery/procedure.  After Midnight you may have the following liquids until 04:30 AM DAY OF SURGERY  Clear Liquid Diet Water Black Coffee (sugar ok, NO MILK/CREAM OR CREAMERS)  Tea (sugar ok, NO MILK/CREAM OR CREAMERS) regular and decaf                             Plain Jell-O  with no fruit (NO RED)                                           Fruit ices (not with fruit pulp, NO RED)                                     Popsicles (NO RED)                                                                  Juice: NO CITRUS JUICES: only apple, WHITE grape, WHITE cranberry Sports drinks like Gatorade or Powerade (NO RED)  Oral Hygiene is also important to reduce your risk of infection.        Remember - BRUSH YOUR TEETH THE MORNING OF SURGERY WITH YOUR REGULAR TOOTHPASTE  Do NOT smoke after Midnight the night before surgery.  STOP TAKING all Vitamins, Herbs and supplements 1 week before your surgery.   Take ONLY these medicines the morning of surgery with A SIP OF WATER:  none   You may not have any metal on your body including hair pins, jewelry, and body piercing  Do not wear make-up, lotions, powders, perfumes  or deodorant  Do not wear nail polish including gel and S&S, artificial / acrylic nails, or any other type of covering on natural nails including finger and toenails. If you have artificial nails, gel coating, etc., that needs to be removed by a nail salon, Please have this removed prior to surgery. Not doing so may mean that your surgery could be cancelled or delayed if the Surgeon or anesthesia staff feels like they are unable to monitor you safely.   Do not shave 48 hours prior to surgery to avoid nicks in your skin which may contribute to postoperative infections.   Contacts, Hearing Aids, dentures or bridgework may not be worn into surgery. DENTURES WILL BE REMOVED PRIOR TO SURGERY PLEASE DO NOT APPLY Poly grip OR ADHESIVES!!!  You may bring a small overnight  bag with you on the day of surgery, only pack items that are not valuable. [facility] IS NOT RESPONSIBLE   FOR VALUABLES THAT ARE LOST OR STOLEN.   Patients discharged on the day of surgery will not be allowed to drive home.  Someone NEEDS to stay with you for the first 24 hours after anesthesia.  Do not bring your home medications to the hospital. The Pharmacy will dispense medications listed on your medication list to you during your admission in the Hospital.  Please read over the following fact sheets you were given: IF YOU HAVE QUESTIONS ABOUT YOUR PRE-OP INSTRUCTIONS, PLEASE CALL [phone]   [facility] - Preparing for Surgery Before surgery, you can play an important role.  Because skin is not sterile, your skin needs to be as free of germs as possible.  You can reduce the number of germs on your skin by washing with CHG (chlorahexidine gluconate) soap before surgery.  CHG is an antiseptic cleaner which kills germs and bonds with the skin to continue killing germs even after washing. Please DO NOT use if you have an allergy to CHG or antibacterial soaps.  If your skin becomes reddened/irritated stop using the CHG and inform your nurse when you arrive at Short Stay. Do not shave (including legs and underarms) for at least 48 hours prior to the first CHG shower.  You may shave your face/neck.  Please follow these instructions carefully:  1.  Shower with CHG Soap the night before surgery and the  morning of surgery.  2.  If you choose to wash your hair, wash your hair first as usual with your normal  shampoo.  3.  After you shampoo, rinse your hair and body thoroughly to remove the shampoo.                             4.  Use CHG as you would any other liquid soap.  You can apply chg directly to the skin and wash.  Gently with a scrungie or clean washcloth.  5.  Apply the CHG Soap to your body ONLY FROM THE NECK DOWN.   Do not use on face/ open                           Wound or open  sores. Avoid contact with eyes, ears mouth and genitals (private parts).                       Wash face,  Genitals (private parts) with your normal soap.             6.  Wash thoroughly, paying special attention to the area where your  surgery  will be performed.  7.  Thoroughly rinse your body with warm water from the neck down.  8.  DO NOT shower/wash with your normal soap after using and rinsing off the CHG Soap.            9.  Pat yourself dry with a clean towel.            10.  Wear clean pajamas.            11.  Place clean sheets on your bed the night of your first shower and do not  sleep with pets.  ON THE DAY OF SURGERY : Do not apply any lotions/deodorants the morning of surgery.  Please wear clean clothes to the hospital/surgery center.     FAILURE TO FOLLOW THESE INSTRUCTIONS MAY RESULT IN THE CANCELLATION OF YOUR SURGERY  PATIENT SIGNATURE_________________________________  NURSE SIGNATURE__________________________________  ________________________________________________________________________

## 2024-05-18 NOTE — Progress Notes (Signed)
 COVID Vaccine received:  []  No [x]  Yes Date of any COVID positive Test in last 90 days:  PCP - Connie Emperor, MD  Cardiologist -   Chest x-ray -  EKG - will do at [facility] Stress Test -  ECHO -  Cardiac Cath -  CT Coronary Calcium score:   Bowel Prep - []  No  []   Yes ______  Pacemaker / ICD device [x]  No []  Yes   Spinal Cord Stimulator:[x]  No []  Yes       History of Sleep Apnea? [x]  No []  Yes   CPAP used?- [x]  No []  Yes    Does the patient monitor blood sugar?   []  N/A   []  No []  Yes  Patient has: []  NO Hx DM   []  Pre-DM   []  DM1  [x]   DM2 Last A1c was:  5.5 on 05-17-2024     Does patient have a [employer] or Dexcom? []  No []  Yes   Fasting Blood Sugar Ranges-  Checks Blood Sugar _____ times a day  Blood Thinner / Instructions:  none Aspirin Instructions:  none  ERAS Protocol Ordered: []  No  []  Yes PRE-SURGERY []  ENSURE  []  G2   []  No Drink Ordered  Patient is to be NPO after:    NO ORDERS  Dental hx: []  Dentures:  []  N/A      []  Bridge or Partial:                   []  Loose or Damaged teeth:   Comments:   Activity level: Able to walk up 2 flights of stairs without becoming significantly short of breath or having chest pain?  []  No   []    Yes   Anesthesia review: DM2, GERD, MDD   Patient denies shortness of breath, fever, cough and chest pain at PAT appointment.  Patient verbalized understanding and agreement to the Pre-Surgical Instructions that were given to them at this PAT appointment. Patient was also educated of the need to review these PAT instructions again prior to her surgery.I reviewed the appropriate phone numbers to call if they have any and questions or concerns.

## 2024-05-20 ENCOUNTER — Other Ambulatory Visit: Payer: Self-pay

## 2024-05-20 ENCOUNTER — Encounter (HOSPITAL_COMMUNITY): Payer: Self-pay

## 2024-05-20 ENCOUNTER — Telehealth: Payer: Self-pay

## 2024-05-20 ENCOUNTER — Telehealth: Payer: Self-pay | Admitting: Family Medicine

## 2024-05-20 ENCOUNTER — Encounter (HOSPITAL_COMMUNITY)
Admission: RE | Admit: 2024-05-20 | Discharge: 2024-05-20 | Disposition: A | Discharge status: Home / Self Care | Source: Ambulatory Visit | Attending: Gynecologic Oncology | Admitting: Gynecologic Oncology

## 2024-05-20 VITALS — BP 101/58 | HR 66 | Temp 98.3°F | Resp 16 | Ht 62.0 in | Wt 138.0 lb

## 2024-05-20 DIAGNOSIS — Z01818 Encounter for other preprocedural examination: Secondary | ICD-10-CM | POA: Diagnosis present

## 2024-05-20 DIAGNOSIS — N9489 Other specified conditions associated with female genital organs and menstrual cycle: Secondary | ICD-10-CM | POA: Insufficient documentation

## 2024-05-20 DIAGNOSIS — E1165 Type 2 diabetes mellitus with hyperglycemia: Secondary | ICD-10-CM | POA: Insufficient documentation

## 2024-05-20 DIAGNOSIS — E7800 Pure hypercholesterolemia, unspecified: Secondary | ICD-10-CM

## 2024-05-20 HISTORY — DX: Unspecified osteoarthritis, unspecified site: M19.90

## 2024-05-20 HISTORY — DX: Gastro-esophageal reflux disease without esophagitis: K21.9

## 2024-05-20 LAB — CBC
HCT: 36.6 % (ref 36.0–46.0)
Hemoglobin: 12.1 g/dL (ref 12.0–15.0)
MCH: 30.3 pg (ref 26.0–34.0)
MCHC: 33.1 g/dL (ref 30.0–36.0)
MCV: 91.7 fL (ref 80.0–100.0)
Platelets: 365 10*3/uL (ref 150–400)
RBC: 3.99 MIL/uL (ref 3.87–5.11)
RDW: 12.1 % (ref 11.5–15.5)
WBC: 10 10*3/uL (ref 4.0–10.5)
nRBC: 0 % (ref 0.0–0.2)

## 2024-05-20 LAB — COMPREHENSIVE METABOLIC PANEL WITH GFR
ALT: 29 U/L (ref 0–44)
AST: 17 U/L (ref 15–41)
Albumin: 3.8 g/dL (ref 3.5–5.0)
Alkaline Phosphatase: 77 U/L (ref 38–126)
Anion gap: 7 (ref 5–15)
BUN: 19 mg/dL (ref 8–23)
CO2: 26 mmol/L (ref 22–32)
Calcium: 9.4 mg/dL (ref 8.9–10.3)
Chloride: 105 mmol/L (ref 98–111)
Creatinine, Ser: 1.29 mg/dL — ABNORMAL HIGH (ref 0.44–1.00)
GFR, Estimated: 47 mL/min — ABNORMAL LOW (ref >60)
Glucose, Bld: 158 mg/dL — ABNORMAL HIGH (ref 70–99)
Potassium: 3.9 mmol/L (ref 3.5–5.1)
Sodium: 138 mmol/L (ref 135–145)
Total Bilirubin: 0.8 mg/dL (ref 0.0–1.2)
Total Protein: 6.7 g/dL (ref 6.5–8.1)

## 2024-05-20 LAB — TYPE AND SCREEN
ABO/RH(D): O NEG
Antibody Screen: NEGATIVE

## 2024-05-20 LAB — GLUCOSE, CAPILLARY: Glucose-Capillary: 176 mg/dL — ABNORMAL HIGH (ref 70–99)

## 2024-05-20 MED ORDER — SENNOSIDES-DOCUSATE SODIUM 8.6-50 MG PO TABS: 2.0000 | ORAL_TABLET | Freq: Every day | ORAL | 0 refills | Status: AC

## 2024-05-20 MED ORDER — TRAMADOL HCL 50 MG PO TABS: 50.0000 mg | ORAL_TABLET | Freq: Four times a day (QID) | ORAL | 0 refills | Status: DC | PRN

## 2024-05-20 NOTE — Telephone Encounter (Signed)
 Faxed over surgical optimization form to Dr Aletha office at fax #[phone].SABRA

## 2024-05-20 NOTE — Addendum Note (Signed)
 Addended by: MICHELINE SETTER D on: 05/20/2024 03:08 PM   Modules accepted: Orders

## 2024-05-20 NOTE — Telephone Encounter (Signed)
 Copied from CRM [id number]. Topic: Clinical - Medication Prior Auth >> May 20, 2024  9:41 AM Gustabo D wrote: April calling from [facility] Requesting medical clearance for her surgery. Will be sending over fax. Call back [phone]

## 2024-05-21 ENCOUNTER — Ambulatory Visit: Payer: Self-pay | Admitting: *Deleted

## 2024-05-21 LAB — CYTOLOGY - PAP
Comment: NEGATIVE
Diagnosis: NEGATIVE
High risk HPV: NEGATIVE

## 2024-05-21 LAB — INHIBIN B: Inhibin B: <7 pg/mL (ref 0.0–16.9)

## 2024-05-21 NOTE — Telephone Encounter (Signed)
-----   Message from Eleanor JONETTA Epps sent at 05/20/2024  2:50 PM EDT ----- She had her preop labs today. Her creatinine level has increased to 1.29 from 0.95 five days ago. Please call her and fax results to her PCP as well. Please make sure she is staying hydrated,  avoiding nephrotoxic meds (ibup, aleve). Any recent changes from five days ago? We will plan on repeating this the morning of surgery. ----- Message ----- From: Rebecka, Lab In [place] Sent: 05/20/2024   2:09 PM EDT To: Eleanor JONETTA Epps, NP

## 2024-05-21 NOTE — Telephone Encounter (Addendum)
 Spoke with Ms. Con and relayed message from Eleanor Epps, NP that her pre-op labs done yesterday shows an elevation in creatine level (kidney function) this level increased to 1.29 from .95 five days ago. Advised patient to stay well hydrated and avoid nephrotoxic meds like ibuprofen and aleve. Pt states no recent changes from five days ago. And we will plan on repeating labs the morning of surgery. Pt verbalized understanding, will plan to keep herself well hydrated and thanked the office for calling.   Results of labs faxed to patient's PCP Dr. Aletha at fax# [phone].

## 2024-05-22 ENCOUNTER — Telehealth: Payer: Self-pay | Admitting: *Deleted

## 2024-05-22 LAB — SURGICAL PATHOLOGY

## 2024-05-22 NOTE — Telephone Encounter (Signed)
 Copied from CRM [id number]. Topic: General - Other >> May 22, 2024  9:24 AM Viola F wrote: Reason for CRM: GYN Oncology called to follow up on optimization form that was faxed 05/20/24 - per clinic access line form was received. Patient is scheduled for surgery tomorrow.

## 2024-05-22 NOTE — Telephone Encounter (Signed)
 Telephone call to check on pre-operative status.  Patient compliant with pre-operative instructions.  Reinforced nothing to eat after midnight. Clear liquids until 0415. Patient to arrive at 0515.  No questions or concerns voiced.  Instructed to call for any needs.

## 2024-05-23 ENCOUNTER — Other Ambulatory Visit: Payer: Self-pay

## 2024-05-23 ENCOUNTER — Ambulatory Visit (HOSPITAL_COMMUNITY)
Admission: RE | Admit: 2024-05-23 | Discharge: 2024-05-23 | Disposition: A | Discharge status: Home / Self Care | Attending: Gynecologic Oncology | Admitting: Gynecologic Oncology

## 2024-05-23 ENCOUNTER — Encounter (HOSPITAL_COMMUNITY): Payer: Self-pay | Admitting: Certified Registered Nurse Anesthetist

## 2024-05-23 ENCOUNTER — Encounter (HOSPITAL_COMMUNITY): Payer: Self-pay | Admitting: Medical

## 2024-05-23 ENCOUNTER — Encounter (HOSPITAL_COMMUNITY): Payer: Self-pay | Admitting: Gynecologic Oncology

## 2024-05-23 ENCOUNTER — Encounter (HOSPITAL_COMMUNITY)
Admission: RE | Disposition: A | Discharge status: Home / Self Care | Payer: Self-pay | Source: Home / Self Care | Attending: Gynecologic Oncology

## 2024-05-23 DIAGNOSIS — R971 Elevated cancer antigen 125 [CA 125]: Secondary | ICD-10-CM | POA: Diagnosis not present

## 2024-05-23 DIAGNOSIS — D270 Benign neoplasm of right ovary: Secondary | ICD-10-CM

## 2024-05-23 DIAGNOSIS — R7989 Other specified abnormal findings of blood chemistry: Secondary | ICD-10-CM

## 2024-05-23 DIAGNOSIS — E7800 Pure hypercholesterolemia, unspecified: Secondary | ICD-10-CM

## 2024-05-23 DIAGNOSIS — E1165 Type 2 diabetes mellitus with hyperglycemia: Secondary | ICD-10-CM

## 2024-05-23 DIAGNOSIS — K219 Gastro-esophageal reflux disease without esophagitis: Secondary | ICD-10-CM | POA: Diagnosis not present

## 2024-05-23 DIAGNOSIS — Z7984 Long term (current) use of oral hypoglycemic drugs: Secondary | ICD-10-CM | POA: Insufficient documentation

## 2024-05-23 DIAGNOSIS — R9389 Abnormal findings on diagnostic imaging of other specified body structures: Secondary | ICD-10-CM | POA: Insufficient documentation

## 2024-05-23 DIAGNOSIS — M1990 Unspecified osteoarthritis, unspecified site: Secondary | ICD-10-CM

## 2024-05-23 DIAGNOSIS — E119 Type 2 diabetes mellitus without complications: Secondary | ICD-10-CM | POA: Diagnosis not present

## 2024-05-23 DIAGNOSIS — Z7985 Long-term (current) use of injectable non-insulin antidiabetic drugs: Secondary | ICD-10-CM | POA: Insufficient documentation

## 2024-05-23 DIAGNOSIS — N9489 Other specified conditions associated with female genital organs and menstrual cycle: Secondary | ICD-10-CM

## 2024-05-23 DIAGNOSIS — N840 Polyp of corpus uteri: Secondary | ICD-10-CM | POA: Insufficient documentation

## 2024-05-23 HISTORY — PX: ROBOTIC ASSISTED TOTAL HYSTERECTOMY WITH BILATERAL SALPINGO OOPHERECTOMY: SHX6086

## 2024-05-23 LAB — BASIC METABOLIC PANEL WITH GFR
Anion gap: 8 (ref 5–15)
BUN: 14 mg/dL (ref 8–23)
CO2: 23 mmol/L (ref 22–32)
Calcium: 9.6 mg/dL (ref 8.9–10.3)
Chloride: 107 mmol/L (ref 98–111)
Creatinine, Ser: 0.89 mg/dL (ref 0.44–1.00)
GFR, Estimated: >60 mL/min (ref >60)
Glucose, Bld: 126 mg/dL — ABNORMAL HIGH (ref 70–99)
Potassium: 4.1 mmol/L (ref 3.5–5.1)
Sodium: 138 mmol/L (ref 135–145)

## 2024-05-23 LAB — ABO/RH: ABO/RH(D): O NEG

## 2024-05-23 LAB — GLUCOSE, CAPILLARY
Glucose-Capillary: 116 mg/dL — ABNORMAL HIGH (ref 70–99)
Glucose-Capillary: 103 mg/dL — ABNORMAL HIGH (ref 70–99)

## 2024-05-23 SURGERY — HYSTERECTOMY, TOTAL, ROBOT-ASSISTED, LAPAROSCOPIC, WITH BILATERAL SALPINGO-OOPHORECTOMY
Anesthesia: General | Site: Pelvis | Laterality: Bilateral

## 2024-05-23 MED ORDER — DROPERIDOL 2.5 MG/ML IJ SOLN
INTRAMUSCULAR | Status: AC
  Filled 2024-05-23: qty 2

## 2024-05-23 MED ORDER — FENTANYL CITRATE PF 50 MCG/ML IJ SOSY
PREFILLED_SYRINGE | INTRAMUSCULAR | Status: AC
  Filled 2024-05-23: qty 2

## 2024-05-23 MED ORDER — FENTANYL CITRATE (PF) 250 MCG/5ML IJ SOLN
INTRAMUSCULAR | Status: DC | PRN
  Administered 2024-05-23: 50 ug via INTRAVENOUS
  Administered 2024-05-23: 100 ug via INTRAVENOUS
  Administered 2024-05-23 (×2): 50 ug via INTRAVENOUS

## 2024-05-23 MED ORDER — ONDANSETRON HCL 4 MG/2ML IJ SOLN
INTRAMUSCULAR | Status: AC
Start: 2024-05-23 — End: 2024-05-23
  Filled 2024-05-23: qty 2

## 2024-05-23 MED ORDER — CHLORHEXIDINE GLUCONATE 0.12 % MT SOLN
15.0000 mL | Freq: Once | OROMUCOSAL | Status: AC
  Administered 2024-05-23: 15 mL via OROMUCOSAL

## 2024-05-23 MED ORDER — EPHEDRINE 5 MG/ML INJ
INTRAVENOUS | Status: AC
  Filled 2024-05-23: qty 5

## 2024-05-23 MED ORDER — CEFAZOLIN SODIUM-DEXTROSE 2-4 GM/100ML-% IV SOLN
2.0000 g | INTRAVENOUS | Status: AC
  Administered 2024-05-23: 2 g via INTRAVENOUS
  Filled 2024-05-23: qty 100

## 2024-05-23 MED ORDER — OXYCODONE HCL 5 MG/5ML PO SOLN: 5.0000 mg | Freq: Once | ORAL | Status: AC | PRN

## 2024-05-23 MED ORDER — ONDANSETRON HCL 4 MG/2ML IJ SOLN
INTRAMUSCULAR | Status: AC
  Filled 2024-05-23: qty 2

## 2024-05-23 MED ORDER — STERILE WATER FOR INJECTION IJ SOLN
INTRAMUSCULAR | Status: AC
  Filled 2024-05-23: qty 10

## 2024-05-23 MED ORDER — LACTATED RINGERS IV SOLN: INTRAVENOUS | Status: DC

## 2024-05-23 MED ORDER — GLYCOPYRROLATE 0.2 MG/ML IJ SOLN
INTRAMUSCULAR | Status: DC | PRN
  Administered 2024-05-23: .1 mg via INTRAVENOUS

## 2024-05-23 MED ORDER — SUGAMMADEX SODIUM 200 MG/2ML IV SOLN
INTRAVENOUS | Status: DC | PRN
  Administered 2024-05-23: 200 mg via INTRAVENOUS

## 2024-05-23 MED ORDER — LACTATED RINGERS IR SOLN
Status: DC | PRN
  Administered 2024-05-23: 1000 mL

## 2024-05-23 MED ORDER — METRONIDAZOLE 500 MG/100ML IV SOLN
500.0000 mg | INTRAVENOUS | Status: AC
  Administered 2024-05-23: 500 mg via INTRAVENOUS
  Filled 2024-05-23: qty 100

## 2024-05-23 MED ORDER — STERILE WATER FOR IRRIGATION IR SOLN
Status: DC | PRN
Start: 2024-05-23 — End: 2024-05-23
  Administered 2024-05-23: 1000 mL

## 2024-05-23 MED ORDER — DEXAMETHASONE SODIUM PHOSPHATE 10 MG/ML IJ SOLN
4.0000 mg | INTRAMUSCULAR | Status: AC
  Administered 2024-05-23: 10 mg via INTRAVENOUS

## 2024-05-23 MED ORDER — ORAL CARE MOUTH RINSE: 15.0000 mL | Freq: Once | OROMUCOSAL | Status: AC

## 2024-05-23 MED ORDER — OXYCODONE HCL 5 MG PO TABS
ORAL_TABLET | ORAL | Status: AC
  Filled 2024-05-23: qty 1

## 2024-05-23 MED ORDER — ROCURONIUM BROMIDE 10 MG/ML (PF) SYRINGE
PREFILLED_SYRINGE | INTRAVENOUS | Status: DC | PRN
  Administered 2024-05-23: 60 mg via INTRAVENOUS

## 2024-05-23 MED ORDER — ACETAMINOPHEN 10 MG/ML IV SOLN: 1000.0000 mg | Freq: Once | INTRAVENOUS | Status: DC | PRN

## 2024-05-23 MED ORDER — DEXAMETHASONE SODIUM PHOSPHATE 10 MG/ML IJ SOLN
INTRAMUSCULAR | Status: AC
  Filled 2024-05-23: qty 1

## 2024-05-23 MED ORDER — LIDOCAINE HCL 2 % IJ SOLN
INTRAMUSCULAR | Status: AC
  Filled 2024-05-23: qty 20

## 2024-05-23 MED ORDER — LIDOCAINE HCL (PF) 2 % IJ SOLN
INTRAMUSCULAR | Status: DC | PRN
  Administered 2024-05-23: 100 mg via INTRADERMAL
  Administered 2024-05-23: 1.5 mg/kg/h via INTRADERMAL

## 2024-05-23 MED ORDER — OXYCODONE HCL 5 MG PO TABS
5.0000 mg | ORAL_TABLET | Freq: Once | ORAL | Status: AC | PRN
  Administered 2024-05-23: 5 mg via ORAL

## 2024-05-23 MED ORDER — ONDANSETRON HCL 4 MG/2ML IJ SOLN
INTRAMUSCULAR | Status: DC | PRN
  Administered 2024-05-23 (×2): 4 mg via INTRAVENOUS

## 2024-05-23 MED ORDER — HEMOSTATIC AGENTS (NO CHARGE) OPTIME
TOPICAL | Status: DC | PRN
  Administered 2024-05-23: 1

## 2024-05-23 MED ORDER — KETAMINE HCL 50 MG/5ML IJ SOSY
PREFILLED_SYRINGE | INTRAMUSCULAR | Status: AC
  Filled 2024-05-23: qty 5

## 2024-05-23 MED ORDER — LACTATED RINGERS IV SOLN: INTRAVENOUS | Status: DC | PRN

## 2024-05-23 MED ORDER — MIDAZOLAM HCL 5 MG/5ML IJ SOLN
INTRAMUSCULAR | Status: DC | PRN
  Administered 2024-05-23: 2 mg via INTRAVENOUS

## 2024-05-23 MED ORDER — PROPOFOL 10 MG/ML IV BOLUS
INTRAVENOUS | Status: DC | PRN
  Administered 2024-05-23: 100 mg via INTRAVENOUS

## 2024-05-23 MED ORDER — HEPARIN SODIUM (PORCINE) 5000 UNIT/ML IJ SOLN
5000.0000 [IU] | INTRAMUSCULAR | Status: AC
  Administered 2024-05-23: 5000 [IU] via SUBCUTANEOUS
  Filled 2024-05-23: qty 1

## 2024-05-23 MED ORDER — KETAMINE HCL 50 MG/5ML IJ SOSY
PREFILLED_SYRINGE | INTRAMUSCULAR | Status: DC | PRN
  Administered 2024-05-23: 20 mg via INTRAVENOUS
  Administered 2024-05-23: 30 mg via INTRAVENOUS

## 2024-05-23 MED ORDER — EPHEDRINE SULFATE-NACL 50-0.9 MG/10ML-% IV SOSY
PREFILLED_SYRINGE | INTRAVENOUS | Status: DC | PRN
  Administered 2024-05-23: 5 mg via INTRAVENOUS

## 2024-05-23 MED ORDER — ROCURONIUM BROMIDE 10 MG/ML (PF) SYRINGE
PREFILLED_SYRINGE | INTRAVENOUS | Status: AC
  Filled 2024-05-23: qty 10

## 2024-05-23 MED ORDER — FENTANYL CITRATE (PF) 250 MCG/5ML IJ SOLN
INTRAMUSCULAR | Status: AC
  Filled 2024-05-23: qty 5

## 2024-05-23 MED ORDER — MIDAZOLAM HCL 2 MG/2ML IJ SOLN
INTRAMUSCULAR | Status: AC
  Filled 2024-05-23: qty 2

## 2024-05-23 MED ORDER — BUPIVACAINE HCL 0.25 % IJ SOLN
INTRAMUSCULAR | Status: DC | PRN
  Administered 2024-05-23: 20 mL

## 2024-05-23 MED ORDER — GLYCOPYRROLATE 0.2 MG/ML IJ SOLN
INTRAMUSCULAR | Status: AC
  Filled 2024-05-23: qty 1

## 2024-05-23 MED ORDER — SUCCINYLCHOLINE CHLORIDE 200 MG/10ML IV SOSY
PREFILLED_SYRINGE | INTRAVENOUS | Status: DC | PRN
  Administered 2024-05-23: 100 mg via INTRAVENOUS

## 2024-05-23 MED ORDER — LIDOCAINE HCL (PF) 2 % IJ SOLN
INTRAMUSCULAR | Status: AC
  Filled 2024-05-23: qty 5

## 2024-05-23 MED ORDER — ACETAMINOPHEN 500 MG PO TABS
1000.0000 mg | ORAL_TABLET | ORAL | Status: AC
  Administered 2024-05-23: 1000 mg via ORAL
  Filled 2024-05-23: qty 2

## 2024-05-23 MED ORDER — KETAMINE HCL 10 MG/ML IJ SOLN: INTRAMUSCULAR | Status: DC | PRN

## 2024-05-23 MED ORDER — BUPIVACAINE HCL (PF) 0.25 % IJ SOLN
INTRAMUSCULAR | Status: AC
Start: 2024-05-23 — End: 2024-05-23
  Filled 2024-05-23: qty 30

## 2024-05-23 MED ORDER — FENTANYL CITRATE PF 50 MCG/ML IJ SOSY
25.0000 ug | PREFILLED_SYRINGE | INTRAMUSCULAR | Status: DC | PRN
  Administered 2024-05-23 (×3): 50 ug via INTRAVENOUS

## 2024-05-23 MED ORDER — ONDANSETRON HCL 4 MG/2ML IJ SOLN
4.0000 mg | Freq: Once | INTRAMUSCULAR | Status: AC | PRN
  Administered 2024-05-23: 4 mg via INTRAVENOUS

## 2024-05-23 MED ORDER — INSULIN ASPART 100 UNIT/ML IJ SOLN: 0.0000 [IU] | INTRAMUSCULAR | Status: DC | PRN

## 2024-05-23 MED ORDER — SURGIFLO WITH THROMBIN (HEMOSTATIC MATRIX KIT) OPTIME
TOPICAL | Status: DC | PRN
  Administered 2024-05-23: 1

## 2024-05-23 MED ORDER — PROPOFOL 10 MG/ML IV BOLUS
INTRAVENOUS | Status: AC
Start: 2024-05-23 — End: 2024-05-23
  Filled 2024-05-23: qty 20

## 2024-05-23 MED ORDER — FENTANYL CITRATE PF 50 MCG/ML IJ SOSY
PREFILLED_SYRINGE | INTRAMUSCULAR | Status: AC
Start: 2024-05-23 — End: 2024-05-23
  Filled 2024-05-23: qty 1

## 2024-05-23 MED ORDER — ESMOLOL HCL 100 MG/10ML IV SOLN
INTRAVENOUS | Status: AC
  Filled 2024-05-23: qty 10

## 2024-05-23 MED ORDER — SODIUM CHLORIDE (PF) 0.9 % IJ SOLN
INTRAMUSCULAR | Status: AC
  Filled 2024-05-23: qty 20

## 2024-05-23 MED ORDER — DROPERIDOL 2.5 MG/ML IJ SOLN
0.6250 mg | Freq: Once | INTRAMUSCULAR | Status: AC
  Administered 2024-05-23: 0.625 mg via INTRAVENOUS

## 2024-05-23 SURGICAL SUPPLY — 69 items
APPLICATOR ARISTA FLEXITIP XL (MISCELLANEOUS) IMPLANT
APPLICATOR SURGIFLO ENDO (HEMOSTASIS) IMPLANT
BAG COUNTER SPONGE SURGICOUNT (BAG) IMPLANT
BAG LAPAROSCOPIC 12 15 PORT 16 (BASKET) IMPLANT
BLADE SURG SZ10 CARB STEEL (BLADE) IMPLANT
BLADE SURG SZ11 CARB STEEL (BLADE) IMPLANT
COVER BACK TABLE 60X90IN (DRAPES) ×1 IMPLANT
COVER TIP SHEARS 8 DVNC (MISCELLANEOUS) ×1 IMPLANT
DERMABOND ADVANCED .7 DNX12 (GAUZE/BANDAGES/DRESSINGS) ×1 IMPLANT
DRAPE ARM DVNC X/XI (DISPOSABLE) ×4 IMPLANT
DRAPE COLUMN DVNC XI (DISPOSABLE) ×1 IMPLANT
DRAPE SHEET LG 3/4 BI-LAMINATE (DRAPES) ×1 IMPLANT
DRAPE SURG IRRIG POUCH 19X23 (DRAPES) ×1 IMPLANT
DRIVER NDL MEGA SUTCUT DVNCXI (INSTRUMENTS) ×1 IMPLANT
DRIVER NDLE MEGA SUTCUT DVNCXI (INSTRUMENTS) ×1 IMPLANT
DRSG OPSITE POSTOP 4X6 (GAUZE/BANDAGES/DRESSINGS) IMPLANT
DRSG OPSITE POSTOP 4X8 (GAUZE/BANDAGES/DRESSINGS) IMPLANT
ELECT PENCIL ROCKER SW 15FT (MISCELLANEOUS) IMPLANT
ELECT REM PT RETURN 15FT ADLT (MISCELLANEOUS) ×1 IMPLANT
FORCEPS BPLR FENES DVNC XI (FORCEP) ×1 IMPLANT
FORCEPS PROGRASP DVNC XI (FORCEP) ×1 IMPLANT
GAUZE 4X4 16PLY ~~LOC~~+RFID DBL (SPONGE) ×2 IMPLANT
GLOVE BIO SURGEON STRL SZ 6 (GLOVE) ×4 IMPLANT
GLOVE BIO SURGEON STRL SZ 6.5 (GLOVE) ×1 IMPLANT
GOWN STRL REUS W/ TWL LRG LVL3 (GOWN DISPOSABLE) ×4 IMPLANT
GRASPER SUT TROCAR 14GX15 (MISCELLANEOUS) IMPLANT
HEMOSTAT ARISTA ABSORB 3G PWDR (HEMOSTASIS) IMPLANT
HOLDER FOLEY CATH W/STRAP (MISCELLANEOUS) IMPLANT
IRRIGATION SUCT STRKRFLW 2 WTP (MISCELLANEOUS) ×1 IMPLANT
KIT PROCEDURE DVNC SI (MISCELLANEOUS) IMPLANT
KIT TURNOVER KIT A (KITS) ×1 IMPLANT
LIGASURE IMPACT 36 18CM CVD LR (INSTRUMENTS) IMPLANT
MANIPULATOR ADVINCU DEL 3.0 PL (MISCELLANEOUS) IMPLANT
MANIPULATOR ADVINCU DEL 3.5 PL (MISCELLANEOUS) IMPLANT
MANIPULATOR UTERINE 4.5 ZUMI (MISCELLANEOUS) IMPLANT
NDL HYPO 21X1.5 SAFETY (NEEDLE) ×1 IMPLANT
NDL SPNL 18GX3.5 QUINCKE PK (NEEDLE) IMPLANT
NEEDLE HYPO 21X1.5 SAFETY (NEEDLE) ×1 IMPLANT
NEEDLE SPNL 18GX3.5 QUINCKE PK (NEEDLE) IMPLANT
OBTURATOR OPTICALSTD 8 DVNC (TROCAR) ×1 IMPLANT
PACK ROBOT GYN CUSTOM WL (TRAY / TRAY PROCEDURE) ×1 IMPLANT
PAD POSITIONING PINK XL (MISCELLANEOUS) ×1 IMPLANT
PORT ACCESS TROCAR AIRSEAL 12 (TROCAR) IMPLANT
SCISSORS MNPLR CVD DVNC XI (INSTRUMENTS) ×1 IMPLANT
SCRUB CHG 4% DYNA-HEX 4OZ (MISCELLANEOUS) IMPLANT
SEAL UNIV 5-12 XI (MISCELLANEOUS) ×4 IMPLANT
SET TRI-LUMEN FLTR TB AIRSEAL (TUBING) ×1 IMPLANT
SPIKE FLUID TRANSFER (MISCELLANEOUS) ×1 IMPLANT
SPONGE T-LAP 18X18 ~~LOC~~+RFID (SPONGE) IMPLANT
SURGIFLO W/THROMBIN 8M KIT (HEMOSTASIS) IMPLANT
SUT MNCRL AB 4-0 PS2 18 (SUTURE) IMPLANT
SUT PDS AB 1 TP1 96 (SUTURE) IMPLANT
SUT STRATA PDS 0 30 CT-2.5 (SUTURE) IMPLANT
SUT V-LOC 180 0-0 GS22 (SUTURE) IMPLANT
SUT VIC AB 0 CT1 27XBRD ANTBC (SUTURE) IMPLANT
SUT VIC AB 2-0 CT1 TAPERPNT 27 (SUTURE) IMPLANT
SUT VIC AB 2-0 SH 27X BRD (SUTURE) IMPLANT
SUT VIC AB 4-0 PS2 18 (SUTURE) ×2 IMPLANT
SUT VICRYL 0 27 CT2 27 ABS (SUTURE) ×1 IMPLANT
SUT VLOC 180 0 9IN GS21 (SUTURE) IMPLANT
SYR 10ML LL (SYRINGE) IMPLANT
SYSTEM BAG RETRIEVAL 10MM (BASKET) IMPLANT
SYSTEM WOUND ALEXIS 18CM MED (MISCELLANEOUS) IMPLANT
TRAP SPECIMEN MUCUS 40CC (MISCELLANEOUS) IMPLANT
TRAY FOLEY MTR SLVR 16FR STAT (SET/KITS/TRAYS/PACK) ×1 IMPLANT
TROCAR PORT AIRSEAL 5X120 (TROCAR) IMPLANT
UNDERPAD 30X36 HEAVY ABSORB (UNDERPADS AND DIAPERS) ×2 IMPLANT
WATER STERILE IRR 1000ML POUR (IV SOLUTION) ×1 IMPLANT
YANKAUER SUCT BULB TIP 10FT TU (MISCELLANEOUS) IMPLANT

## 2024-05-23 NOTE — Op Note (Addendum)
 OPERATIVE NOTE  Pre-operative Diagnosis: Complex adnexal mass, mildly elevated CA-125  Post-operative Diagnosis: same, ovarian fibroma  Operation: Robotic-assisted laparoscopic total hysterectomy with bilateral salpingo-oophorectomy, vaginal morcellation of large solid ovarian mass  Surgeon: Viktoria Crank MD  Assistant Surgeon: Eleanor Epps, NP (an NP assistant was necessary for tissue manipulation, management of robotic instrumentation, retraction and positioning due to the complexity of the case and hospital policies).   Anesthesia: GET  Urine Output: 300 cc  Operative Findings: On EUA, small uterus, large mass filling the cul de sac and extending almost up to the umbilicus. On intra-abdominal entry, normal upper abdominal survey. Some filmy adhesions between the right aspect of the anterior liver and the diaphragm. Normal omentum, small and large bowel. Uterus 6 cm and normal in appearance. Normal left adnexa. Right ovary replaced by multi-lobulated solid white and smooth mass measuring in total approximately 12-14 cm. Interior aspect of the mass adherent to the rectal mesentery and cul de sac. Minimal ascites. No peritoneal disease noted. On frozen, two benign endometrial polyps. 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   Estimated Blood Loss:  75 cc      Total IV Fluids: see I&O flowsheet         Specimens: uterus, cervix, bilateral tubes and ovaries, pelvic washings         Complications:  None apparent; patient tolerated the procedure well.         Disposition: PACU - hemodynamically stable.  Procedure Details  The patient was seen in the Holding Room. The risks, benefits, complications, treatment options, and expected outcomes were discussed with the patient.  The patient concurred with the proposed plan, giving informed consent.  The site of surgery properly noted/marked. The patient was identified as Joann Carpenter and the procedure verified as a Robotic-assisted  hysterectomy with bilateral salpingo oophorectomy.   After induction of anesthesia, the patient was draped and prepped in the usual sterile manner. Patient was placed in supine position after anesthesia and draped and prepped in the usual sterile manner as follows: Her arms were tucked to her side with all appropriate precautions.  The patient was secured to the bed using padding and tape across her chest.  The patient was placed in the semi-lithotomy position in [place] stirrups.  The perineum and vagina were prepped with CHG. The patient's abdomen was prepped with ChloraPrep and then she was draped after the prep had been allowed to dry for 3 minutes.  A Time Out was held and the above information confirmed.  The urethra was prepped with Betadine. Foley catheter was placed.  A sterile speculum was placed in the vagina.  The cervix was grasped with a single-tooth tenaculum. The cervix was dilated with Fredirick dilators.  The ZUMI uterine manipulator with a small colpotomizer ring was placed without difficulty.  A pneum occluder balloon was placed over the manipulator.  OG tube placement was confirmed and to suction.   Next, a 5 mm skin incision was made 1 cm below the subcostal margin in the midclavicular line.  The 5 mm Optiview port and scope was used for direct entry.  Opening pressure was under 10 mm CO2.  The abdomen was insufflated and the findings were noted as above.   At this point and all points during the procedure, the patient's intra-abdominal pressure did not exceed 15 mmHg. Next, an 8 mm skin incision was made superior to the umbilicus and a right and left port were placed about 8 cm lateral to the  robot port on the right and left side.  A fourth arm was placed on the right.  The 5 mm assist trocar was exchanged for a 5 mm airseal port. All ports were placed under direct visualization.  The patient was placed in steep Trendelenburg.  The robot was docked in the normal manner.  The right and left  peritoneum were opened parallel to the IP ligament to open the retroperitoneal spaces bilaterally. The round ligaments were transected. The ureter was noted to be on the medial leaf of the broad ligament.  The peritoneum above the ureter was incised and stretched and the infundibulopelvic ligament was skeletonized, cauterized and cut.    The posterior peritoneum was taken down to the level of the KOH ring.  The inferior aspect of the mass, which was mildly adherent to the posterior cul de sac and mesentery of the rectum, was carefully mobilized using blunt dissection. The utero-ovarian ligament and fallopian tube were skeletonized, cauterized, and transected, freeing the right adnexa (which was moved into the upper abdomen to facilitate the remainder of the hysterectomy. The anterior peritoneum was also taken down.  The bladder flap was created to the level of the KOH ring.  The uterine artery on the right side was skeletonized, cauterized and cut in the normal manner.  A similar procedure was performed on the left.  The colpotomy was made and the uterus, cervix, bilateral ovaries and tubes were amputated and delivered through the vagina.  Pedicles were inspected and excellent hemostasis was achieved.  A 15 mm endocatch bag was placed in through the vagina and the right adnexa was placed in the bag.  Attention was then turned to the vagina. With the bag opening outside of the vagina, morcellation of the right solid ovarian mass was performed using triple-toothed Laheys and a scalpel. Once completely removed, the bag was removed (noted to be intact). Both the uterus as well as the right adnexa were sent for frozen section.   The colpotomy at the vaginal cuff was closed with 0 Vicryl with a figure-of-eight stitch at each apex and 0 V-Loc to close the midportion of the cuff in two layers in a running manner. Two very small (< 1 cm) superficial defects of the bladder serosa were oversewed with a figure-of-eight  using 2-0 Vicryl.  Irrigation was used and excellent hemostasis was achieved. Given raw surface of the rectal mesentery, both arista and floseal were placed to consolidate hemostasis. Intra-abdominal pressure was decreased to 5 mm Hg with excellent hemostasis maintained. Once frozen section results were back, the procedure was completed.  Robotic instruments were removed under direct visulaization.  The robot was undocked. The abdomen was desufflated. The subcuticular tissue was closed with 4-0 Vicryl and the skin was closed with 4-0 Monocryl in a subcuticular manner.  Dermabond was applied.    Small amount of bleeding was noted from a distal anterior vaginal laceration (at 10 o'clock) and a distal vaginal laceration. Both were repaired with running locked 2-0 Vicryl. Foley catheter was removed.  All sponge, lap and needle counts were correct x  3.   The patient was transferred to the recovery room in stable condition.  Comer Dollar, MD

## 2024-05-23 NOTE — Transfer of Care (Signed)
 Immediate Anesthesia Transfer of Care Note  Patient: Joann Carpenter  Procedure(s) Performed: HYSTERECTOMY, TOTAL, ROBOT-ASSISTED, LAPAROSCOPIC, WITH BILATERAL SALPINGO-OOPHORECTOMY (Bilateral: Pelvis)  Patient Location: PACU  Anesthesia Type:General  Level of Consciousness: awake, alert , and oriented  Airway & Oxygen Therapy: Patient Spontanous Breathing and Patient connected to face mask oxygen  Post-op Assessment: Report given to RN and Post -op Vital signs reviewed and stable  Post vital signs: Reviewed and stable  Last Vitals:  Vitals Value Taken Time  BP 143/119 05/23/24 10:19  Temp    Pulse 94 05/23/24 10:19  Resp 16 05/23/24 10:19  SpO2 100 % 05/23/24 10:19  Vitals shown include unfiled device data.  Last Pain:  Vitals:   05/23/24 0534  TempSrc:   PainSc: 0-No pain         Complications: No notable events documented.

## 2024-05-23 NOTE — Discharge Instructions (Signed)
 AFTER SURGERY INSTRUCTIONS   Return to work: 4-6 weeks if applicable  Today Dr. Viktoria removed the uterus, cervix, tubes, and ovaries. The large cyst/mass was coming from the right ovary. The pathologist did not see a precancer or a cancer within the mass on frozen section but we will wait for final pathology to confirm this.  You had some small tears occur at the entrance of the vagina. Stitches were placed in these areas. Your vagina/vulva may be sore. You can rinse your bottom with a squirt bottle and pat dry after using the bathroom.   If needed for discomfort: We recommend purchasing several bags of frozen green peas and dividing them into ziploc bags. You will want to keep these in the freezer and have them ready to use as ice packs to the vulva where you had stitches placed on your bottom. Once the ice pack is no longer cold, you can get another from the freezer. The frozen peas mold to your body better than a regular ice pack.   We rechecked your kidney function this am and it has improved and is back in normal range.   Activity: 1. Be up and out of the bed during the day.  Take a nap if needed.  You may walk up steps but be careful and use the hand rail.  Stair climbing will tire you more than you think, you may need to stop part way and rest.    2. No lifting or straining for 6 weeks over 10 pounds. No pushing, pulling, straining for 6 weeks.   3. No driving for 4-89 days when the following criteria have been met: Do not drive if you are taking narcotic pain medicine and make sure that your reaction time has returned.    4. You can shower as soon as the next day after surgery. Shower daily.  Use your regular soap and water (not directly on the incision) and pat your incision(s) dry afterwards; don't rub.  No tub baths or submerging your body in water until cleared by your surgeon. If you have the soap that was given to you by pre-surgical testing that was used before surgery, you do not  need to use it afterwards because this can irritate your incisions.    5. No sexual activity and nothing in the vagina for 12 weeks.   6. You may experience a small amount of clear drainage from your incisions, which is normal.  If the drainage persists, increases, or changes color please call the office.   7. Do not use creams, lotions, or ointments such as neosporin on your incisions after surgery until advised by your surgeon because they can cause removal of the dermabond glue on your incisions.     8. You may experience vaginal spotting after surgery or when the stitches at the top of the vagina begin to dissolve.  The spotting is normal but if you experience heavy bleeding, call our office.   9. Take Tylenol  or ibuprofen first for pain if you are able to take these medications and only use narcotic pain medication for severe pain not relieved by the Tylenol  or Ibuprofen.  Monitor your Tylenol  intake to a max of 4,000 mg in a 24 hour period. You can alternate these medications after surgery.   Diet: 1. Low sodium Heart Healthy Diet is recommended but you are cleared to resume your normal (before surgery) diet after your procedure.   2. It is safe to use a laxative,  such as Miralax or Colace, if you have difficulty moving your bowels before surgery. You have been prescribed Sennakot-S to take at bedtime every evening after surgery to keep bowel movements regular and to prevent constipation.     Wound Care: 1. Keep clean and dry.  Shower daily.   Reasons to call the Doctor: Fever - Oral temperature greater than 100.4 degrees Fahrenheit Foul-smelling vaginal discharge Difficulty urinating Nausea and vomiting Increased pain at the site of the incision that is unrelieved with pain medicine. Difficulty breathing with or without chest pain New calf pain especially if only on one side Sudden, continuing increased vaginal bleeding with or without clots.   Contacts: For questions or  concerns you should contact:   Dr. Comer Dollar at [phone]   Eleanor Epps, NP at [phone]   After Hours: call [phone] and have the GYN Oncologist paged/contacted (after 5 pm or on the weekends). You will speak with an after hours RN and let he or she know you have had surgery.   Messages sent via mychart are for non-urgent matters and are not responded to after hours so for urgent needs, please call the after hours number.

## 2024-05-23 NOTE — Anesthesia Preprocedure Evaluation (Signed)
 Anesthesia Evaluation  Patient identified by MRN, date of birth, ID band Patient awake    Reviewed: Allergy & Precautions, NPO status , Patient's Chart, lab work & pertinent test results, reviewed documented beta blocker date and time   History of Anesthesia Complications Negative for: history of anesthetic complications  Airway Mallampati: II       Dental no notable dental hx.    Pulmonary neg COPD, neg PE   breath sounds clear to auscultation       Cardiovascular (-) hypertension(-) angina (-) CAD and (-) Past MI  Rhythm:Regular Rate:Normal     Neuro/Psych neg Seizures PSYCHIATRIC DISORDERS Anxiety Depression       GI/Hepatic ,GERD  ,,(+) neg Cirrhosis      Large cystic masses centrally in the pelvis and in the right adnexa, presumably arising from the right ovary. 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 These could be further evaluated with ultrasound.    Endo/Other  diabetes, Type 2    Renal/GU Renal disease   Large cystic masses centrally in the pelvis and in the right adnexa, presumably arising from the right ovary. 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 These could be further evaluated with ultrasound.     Musculoskeletal  (+) Arthritis , Osteoarthritis,    Abdominal   Peds  Hematology   Anesthesia Other Findings   Reproductive/Obstetrics                              Anesthesia Physical Anesthesia Plan  ASA: 3  Anesthesia Plan: General   Post-op Pain Management:    Induction: Intravenous  PONV Risk Score and Plan: 3 and Ondansetron  and Dexamethasone  Airway Management Planned: Oral ETT  Additional Equipment:   Intra-op Plan:   Post-operative Plan: Extubation in OR  Informed Consent: I have reviewed the patients History and Physical, chart, labs and discussed the procedure including the risks, benefits and alternatives for the proposed  anesthesia with the patient or authorized representative who has indicated his/her understanding and acceptance.     Dental advisory given  Plan Discussed with: CRNA  Anesthesia Plan Comments:          Anesthesia Quick Evaluation

## 2024-05-23 NOTE — Anesthesia Postprocedure Evaluation (Signed)
 Anesthesia Post Note  Patient: Joann Carpenter  Procedure(s) Performed: HYSTERECTOMY, TOTAL, ROBOT-ASSISTED, LAPAROSCOPIC, WITH BILATERAL SALPINGO-OOPHORECTOMY (Bilateral: Pelvis)     Patient location during evaluation: PACU Anesthesia Type: General Level of consciousness: awake and alert Pain management: pain level controlled Vital Signs Assessment: post-procedure vital signs reviewed and stable Respiratory status: spontaneous breathing, nonlabored ventilation, respiratory function stable and patient connected to nasal cannula oxygen Cardiovascular status: blood pressure returned to baseline and stable Postop Assessment: no apparent nausea or vomiting Anesthetic complications: no   No notable events documented.  Last Vitals:  Vitals:   05/23/24 1130 05/23/24 1200  BP: 138/87 (!) 144/63  Pulse: 65 71  Resp: 13 14  Temp: 36.8 C (!) 36.2 C  SpO2: 97% 99%    Last Pain:  Vitals:   05/23/24 1200  TempSrc:   PainSc: 0-No pain                 Lynwood MARLA Cornea

## 2024-05-23 NOTE — Anesthesia Preprocedure Evaluation (Deleted)
Anesthesia Evaluation  ° ° °Airway ° ° ° ° ° ° ° Dental °  °Pulmonary ° °  ° ° ° ° ° ° ° Cardiovascular ° ° ° °  °Neuro/Psych °  ° GI/Hepatic °  °Endo/Other  °diabetes ° Renal/GU °  ° °  °Musculoskeletal ° ° Abdominal °  °Peds ° Hematology °  °Anesthesia Other Findings ° ° Reproductive/Obstetrics ° °  ° ° ° ° ° ° ° ° ° ° ° ° ° °  °  ° ° ° ° ° ° ° ° °Anesthesia Physical °Anesthesia Plan °Anesthesia Quick Evaluation ° °

## 2024-05-23 NOTE — Anesthesia Procedure Notes (Signed)
 Procedure Name: Intubation Date/Time: 05/23/2024 7:46 AM  Performed by: Zulema Leita PARAS, CRNAPre-anesthesia Checklist: Patient identified, Emergency Drugs available, Suction available and Patient being monitored Patient Re-evaluated:Patient Re-evaluated prior to induction Oxygen Delivery Method: Circle system utilized Preoxygenation: Pre-oxygenation with 100% oxygen Induction Type: IV induction, Rapid sequence and Cricoid Pressure applied Laryngoscope Size: Mac and 4 Grade View: Grade I Tube type: Oral Tube size: 7.0 mm Number of attempts: 1 Airway Equipment and Method: Stylet Placement Confirmation: ETT inserted through vocal cords under direct vision, positive ETCO2 and breath sounds checked- equal and bilateral Secured at: 21 cm Tube secured with: Tape Dental Injury: Teeth and Oropharynx as per pre-operative assessment

## 2024-05-23 NOTE — Interval H&P Note (Signed)
 History and Physical Interval Note:  05/23/2024 6:45 AM  Joann Carpenter  has presented today for surgery, with the diagnosis of ADNEXAL MASS.  The various methods of treatment have been discussed with the patient and family. After consideration of risks, benefits and other options for treatment, the patient has consented to  Procedure(s) with comments: HYSTERECTOMY, TOTAL, ROBOT-ASSISTED, LAPAROSCOPIC, WITH BILATERAL SALPINGO-OOPHORECTOMY (Bilateral) - POSSIBLE STAGING as a surgical intervention.  The patient's history has been reviewed, patient examined, no change in status, stable for surgery.  I have reviewed the patient's chart and labs.  Questions were answered to the patient's satisfaction.     [name]

## 2024-05-24 ENCOUNTER — Encounter (HOSPITAL_COMMUNITY): Payer: Self-pay | Admitting: Gynecologic Oncology

## 2024-05-27 ENCOUNTER — Telehealth: Payer: Self-pay

## 2024-05-27 ENCOUNTER — Telehealth: Payer: Self-pay | Admitting: *Deleted

## 2024-05-27 ENCOUNTER — Ambulatory Visit: Payer: Self-pay | Admitting: Gynecologic Oncology

## 2024-05-27 LAB — SURGICAL PATHOLOGY

## 2024-05-27 NOTE — Telephone Encounter (Signed)
 Spoke with Ms. Rackers this morning. She states she is eating, drinking and urinating well. She has had a BM and is passing gas. She is taking senokot as prescribed and encouraged her to drink plenty of water . She denies fever or chills. Incisions are dry and intact. She rates her pain 1/10. Her pain is controlled with tylenol  but hasn't needed anything in the last 24 hours.     Instructed to call office with any fever, chills, purulent drainage, uncontrolled pain or any other questions or concerns. Patient verbalizes understanding.   Pt aware of post op appointments as well as the office number [phone] and after hours number [phone] to call if she has any questions or concerns

## 2024-05-27 NOTE — Telephone Encounter (Signed)
 Spoke with patient and moved phone visit from 7/10 to 7/9.SABRA patient confirmed

## 2024-05-29 ENCOUNTER — Inpatient Hospital Stay: Attending: Gynecologic Oncology | Admitting: Gynecologic Oncology

## 2024-05-29 ENCOUNTER — Encounter: Payer: Self-pay | Admitting: Gynecologic Oncology

## 2024-05-29 DIAGNOSIS — D271 Benign neoplasm of left ovary: Secondary | ICD-10-CM

## 2024-05-29 DIAGNOSIS — R9389 Abnormal findings on diagnostic imaging of other specified body structures: Secondary | ICD-10-CM

## 2024-05-29 DIAGNOSIS — Z90722 Acquired absence of ovaries, bilateral: Secondary | ICD-10-CM

## 2024-05-29 DIAGNOSIS — Z90710 Acquired absence of both cervix and uterus: Secondary | ICD-10-CM

## 2024-05-29 DIAGNOSIS — N9489 Other specified conditions associated with female genital organs and menstrual cycle: Secondary | ICD-10-CM

## 2024-05-29 DIAGNOSIS — Z9079 Acquired absence of other genital organ(s): Secondary | ICD-10-CM

## 2024-05-29 LAB — CYTOLOGY - NON PAP

## 2024-05-29 NOTE — Progress Notes (Signed)
 Gynecologic Oncology Telehealth Note: Gyn-Onc  I connected with Joann Carpenter on 05/29/24 at  6:00 PM EDT by telephone and verified that I am speaking with the correct person using two identifiers.  I discussed the limitations, risks, security and privacy concerns of performing an evaluation and management service by telemedicine and the availability of in-person appointments. I also discussed with the patient that there may be a patient responsible charge related to this service. The patient expressed understanding and agreed to proceed.  Other persons participating in the visit and their role in the encounter: none.  Patient's location: home, [place] Provider's location: [facility] [place]  Reason for Visit: follow-up  Treatment History: 05/24/24: Robotic-assisted laparoscopic total hysterectomy with bilateral salpingo-oophorectomy, vaginal morcellation of large solid ovarian mass   Interval History: Some soreness, overall feeling better compared to prior to surgery.  Little pink spotting. Some pain with bowel movements. Having regular stools daily. Denies urinary symptoms.  Past Medical/Surgical History: Past Medical History:  Diagnosis Date   Anxiety    Arthritis    Depression    Diabetes mellitus without complication (HCC)    GERD (gastroesophageal reflux disease)    Hypocholesteremia     Past Surgical History:  Procedure Laterality Date   CYST REMOVAL HAND Right 1975   wrist   ROBOTIC ASSISTED TOTAL HYSTERECTOMY WITH BILATERAL SALPINGO OOPHERECTOMY Bilateral 05/23/2024   Procedure: HYSTERECTOMY, TOTAL, ROBOT-ASSISTED, LAPAROSCOPIC, WITH BILATERAL SALPINGO-OOPHORECTOMY;  Surgeon: Viktoria Comer SAUNDERS, MD;  Location: [facility];  Service: Gynecology;  Laterality: Bilateral;   ROOT CANAL      Family History  Problem Relation Age of Onset   Cancer Father        unsure type   Ovarian cancer Maternal Grandmother    Colon cancer Neg Hx    Breast cancer Neg Hx    Endometrial cancer Neg Hx     Pancreatic cancer Neg Hx    Prostate cancer Neg Hx     Social History   Socioeconomic History   Marital status: Married    Spouse name: Not on file   Number of children: Not on file   Years of education: Not on file   Highest education level: Not on file  Occupational History   Not on file  Tobacco Use   Smoking status: Never   Smokeless tobacco: Never  Vaping Use   Vaping status: Never Used  Substance and Sexual Activity   Alcohol use: Yes    Comment: occasionally    Drug use: Never   Sexual activity: Yes  Other Topics Concern   Not on file  Social History Narrative   Not on file   Social Drivers of Health   Financial Resource Strain: Not on file  Food Insecurity: No Food Insecurity (05/16/2024)   Hunger Vital Sign    Worried About Running Out of Food in the Last Year: Never true    Ran Out of Food in the Last Year: Never true  Transportation Needs: No Transportation Needs (05/16/2024)   PRAPARE - Administrator, Civil Service (Medical): No    Lack of Transportation (Non-Medical): No  Physical Activity: Not on file  Stress: Not on file  Social Connections: Not on file    Current Medications:  Current Outpatient Medications:    acetaminophen  (TYLENOL ) 325 MG tablet, Take 650 mg by mouth daily as needed for mild pain (pain score 1-3) or moderate pain (pain score 4-6)., Disp: , Rfl:    citalopram (CELEXA) 40 MG tablet,  Take 40 mg by mouth daily., Disp: , Rfl:    clobetasol cream (TEMOVATE) 0.05 %, Apply topically 2 (two) times daily. (Patient not taking: Reported on 05/20/2024), Disp: , Rfl:    lisinopril (ZESTRIL) 5 MG tablet, Take 5 mg by mouth daily. (Patient not taking: Reported on 05/20/2024), Disp: , Rfl:    metFORMIN (GLUCOPHAGE-XR) 500 MG 24 hr tablet, Take by mouth. (Patient not taking: Reported on 05/16/2024), Disp: , Rfl:    metoCLOPramide  (REGLAN ) 10 MG tablet, Take 0.5 tablets (5 mg total) by mouth every 6 (six) hours. (Patient not taking:  Reported on 05/20/2024), Disp: 30 tablet, Rfl: 0   ondansetron  (ZOFRAN -ODT) 4 MG disintegrating tablet, Take 1 tablet (4 mg total) by mouth every 8 (eight) hours as needed for nausea or vomiting., Disp: 20 tablet, Rfl: 0   OZEMPIC , 1 MG/DOSE, 4 MG/3ML SOPN, INJECT 1  MG INTO THE SKIN  ONCE A WEEK (Patient taking differently: Inject 1 mg into the skin once a week. Monday), Disp: 3 mL, Rfl: 0   pioglitazone (ACTOS) 30 MG tablet, Take 1 tablet by mouth daily., Disp: , Rfl:    senna-docusate (SENOKOT-S) 8.6-50 MG tablet, Take 2 tablets by mouth at bedtime. For AFTER surgery, do not take if having diarrhea, Disp: 30 tablet, Rfl: 0   simvastatin (ZOCOR) 40 MG tablet, Take 40 mg by mouth at bedtime., Disp: , Rfl:    traMADol  (ULTRAM ) 50 MG tablet, Take 1 tablet (50 mg total) by mouth every 6 (six) hours as needed for severe pain (pain score 7-10). For AFTER surgery only, do not take and drive, Disp: 10 tablet, Rfl: 0   traZODone  (DESYREL ) 50 MG tablet, Take 1 tablet (50 mg total) by mouth at bedtime. (Patient taking differently: Take 50 mg by mouth at bedtime as needed for sleep.), Disp: 90 tablet, Rfl: 1  Review of Symptoms: Pertinent positives as per HPI.  Physical Exam: Deferred given limitations of phone visit.  Laboratory & Radiologic Studies: A. UTERUS, CERVIX, LEFT FALLOPIAN TUBE AND OVARY, TOTAL HYSTERECTOMY AND  SALPINGO-OOPHORECTOMY:  - Benign endometrial polyps.  - Background atrophic endometrium.  - Left ovary and fallopian tube with no specific pathologic change.   B. RIGHT FALLOPIAN TUBE AND OVARY, SALPINGO-OOPHORECTOMY:  - Benign ovarian fibroma with degenerative changes.  - Fallopian tube with no specific pathologic change.   Assessment & Plan: Joann Carpenter is a 63 y.o. woman with a complex adnexal mass, mildly elevated CA-125; now s/p TRH/BSO with benign final pathology.  Doing well. Discussed continued expectations and restrictions.  Reviewed pathology - patient very happy  with this news.  I discussed the assessment and treatment plan with the patient. The patient was provided with an opportunity to ask questions and all were answered. The patient agreed with the plan and demonstrated an understanding of the instructions.   The patient was advised to call back or see an in-person evaluation if the symptoms worsen or if the condition fails to improve as anticipated.   8 minutes of total time was spent for this patient encounter, including preparation, phone counseling with the patient and coordination of care, and documentation of the encounter.   Comer Dollar, MD  Division of Gynecologic Oncology  Department of Obstetrics and Gynecology  [facility]

## 2024-05-30 ENCOUNTER — Inpatient Hospital Stay: Admitting: Gynecologic Oncology

## 2024-06-14 ENCOUNTER — Inpatient Hospital Stay (HOSPITAL_BASED_OUTPATIENT_CLINIC_OR_DEPARTMENT_OTHER): Admitting: Gynecologic Oncology

## 2024-06-14 ENCOUNTER — Encounter: Payer: Self-pay | Admitting: Gynecologic Oncology

## 2024-06-14 VITALS — BP 118/56 | HR 67 | Temp 98.4°F | Resp 19 | Wt 141.0 lb

## 2024-06-14 DIAGNOSIS — N9489 Other specified conditions associated with female genital organs and menstrual cycle: Secondary | ICD-10-CM

## 2024-06-14 DIAGNOSIS — Z90710 Acquired absence of both cervix and uterus: Secondary | ICD-10-CM

## 2024-06-14 DIAGNOSIS — Z9079 Acquired absence of other genital organ(s): Secondary | ICD-10-CM

## 2024-06-14 DIAGNOSIS — Z90722 Acquired absence of ovaries, bilateral: Secondary | ICD-10-CM

## 2024-06-14 DIAGNOSIS — D270 Benign neoplasm of right ovary: Secondary | ICD-10-CM

## 2024-06-14 NOTE — Progress Notes (Signed)
 Gynecologic Oncology Return Clinic Visit  06/14/24  Reason for Visit: follow-up  Treatment History: Patient had onset of abdominal pain starting 6/22.  Given worsening pain and development of nausea and emesis, she presented to the emergency department on 6/25.  CT of the abdomen and pelvis showed large cystic masses centrally in the pelvis, larger measuring 9.1 x 8.1 cm and right adnexal cyst measuring 6.5 x 4.8 cm.  Both presumably arising from the right ovary.  No free fluid noted.  No adenopathy or evidence of metastatic disease.  Pelvic ultrasound, uterus measures 7.1 x 2.4 x 4.7 cm.  Endometrial lining measures 15.8 mm and is noted to be heterogenous with cystic components.  Right ovary measures 2.6 x 1.3 x 2.8 cm.  3 right adnexal masses are identified.  1 measures up to 6.2 cm and noted to be complex cystic with diffuse low-level echoes.  A solid appearing mass with internal cystic structures measures up to 5.9 cm, no internal vascularity noted.  Third mass which is described as heterogenous solid without internal vascular flow measures up to 7.8 cm.  Left ovary is normal in appearance without masses.  No free fluid.    CA-125: 65.2.  05/23/24: Robotic-assisted laparoscopic total hysterectomy with bilateral salpingo-oophorectomy, vaginal morcellation of large solid ovarian mass   Interval History: Doing very well.  Denies any significant abdominal pain.  Has had some intermittent back pain.  Reports good bowel movements with Senokot.  Denies any urinary symptoms.  Has noticed a slight odor, denies any vaginal discharge.  Past Medical/Surgical History: Past Medical History:  Diagnosis Date   Anxiety    Arthritis    Depression    Diabetes mellitus without complication (HCC)    GERD (gastroesophageal reflux disease)    Hypocholesteremia     Past Surgical History:  Procedure Laterality Date   CYST REMOVAL HAND Right 1975   wrist   ROBOTIC ASSISTED TOTAL HYSTERECTOMY WITH BILATERAL  SALPINGO OOPHERECTOMY Bilateral 05/23/2024   Procedure: HYSTERECTOMY, TOTAL, ROBOT-ASSISTED, LAPAROSCOPIC, WITH BILATERAL SALPINGO-OOPHORECTOMY;  Surgeon: Viktoria Comer SAUNDERS, MD;  Location: [facility];  Service: Gynecology;  Laterality: Bilateral;   ROOT CANAL      Family History  Problem Relation Age of Onset   Cancer Father        unsure type   Ovarian cancer Maternal Grandmother    Colon cancer Neg Hx    Breast cancer Neg Hx    Endometrial cancer Neg Hx    Pancreatic cancer Neg Hx    Prostate cancer Neg Hx     Social History   Socioeconomic History   Marital status: Married    Spouse name: Not on file   Number of children: Not on file   Years of education: Not on file   Highest education level: Not on file  Occupational History   Not on file  Tobacco Use   Smoking status: Never   Smokeless tobacco: Never  Vaping Use   Vaping status: Never Used  Substance and Sexual Activity   Alcohol use: Yes    Comment: occasionally    Drug use: Never   Sexual activity: Yes  Other Topics Concern   Not on file  Social History Narrative   Not on file   Social Drivers of Health   Financial Resource Strain: Not on file  Food Insecurity: No Food Insecurity (05/16/2024)   Hunger Vital Sign    Worried About Running Out of Food in the Last Year: Never true  Ran Out of Food in the Last Year: Never true  Transportation Needs: No Transportation Needs (05/16/2024)   PRAPARE - Administrator, Civil Service (Medical): No    Lack of Transportation (Non-Medical): No  Physical Activity: Not on file  Stress: Not on file  Social Connections: Not on file    Current Medications:  Current Outpatient Medications:    acetaminophen  (TYLENOL ) 325 MG tablet, Take 650 mg by mouth daily as needed for mild pain (pain score 1-3) or moderate pain (pain score 4-6)., Disp: , Rfl:    citalopram (CELEXA) 40 MG tablet, Take 40 mg by mouth daily., Disp: , Rfl:    clobetasol cream (TEMOVATE) 0.05 %,  Apply topically 2 (two) times daily. (Patient not taking: Reported on 05/20/2024), Disp: , Rfl:    lisinopril (ZESTRIL) 5 MG tablet, Take 5 mg by mouth daily. (Patient not taking: Reported on 05/20/2024), Disp: , Rfl:    metFORMIN (GLUCOPHAGE-XR) 500 MG 24 hr tablet, Take by mouth. (Patient not taking: Reported on 05/16/2024), Disp: , Rfl:    metoCLOPramide  (REGLAN ) 10 MG tablet, Take 0.5 tablets (5 mg total) by mouth every 6 (six) hours. (Patient not taking: Reported on 05/20/2024), Disp: 30 tablet, Rfl: 0   ondansetron  (ZOFRAN -ODT) 4 MG disintegrating tablet, Take 1 tablet (4 mg total) by mouth every 8 (eight) hours as needed for nausea or vomiting., Disp: 20 tablet, Rfl: 0   OZEMPIC , 1 MG/DOSE, 4 MG/3ML SOPN, INJECT 1  MG INTO THE SKIN  ONCE A WEEK (Patient taking differently: Inject 1 mg into the skin once a week. Monday), Disp: 3 mL, Rfl: 0   pioglitazone (ACTOS) 30 MG tablet, Take 1 tablet by mouth daily., Disp: , Rfl:    senna-docusate (SENOKOT-S) 8.6-50 MG tablet, Take 2 tablets by mouth at bedtime. For AFTER surgery, do not take if having diarrhea, Disp: 30 tablet, Rfl: 0   simvastatin (ZOCOR) 40 MG tablet, Take 40 mg by mouth at bedtime., Disp: , Rfl:    traMADol  (ULTRAM ) 50 MG tablet, Take 1 tablet (50 mg total) by mouth every 6 (six) hours as needed for severe pain (pain score 7-10). For AFTER surgery only, do not take and drive (Patient not taking: Reported on 06/12/2024), Disp: 10 tablet, Rfl: 0   traZODone  (DESYREL ) 50 MG tablet, Take 1 tablet (50 mg total) by mouth at bedtime. (Patient taking differently: Take 50 mg by mouth at bedtime as needed for sleep.), Disp: 90 tablet, Rfl: 1  Review of Systems: + back pain Denies appetite changes, fevers, chills, fatigue, unexplained weight changes. Denies hearing loss, neck lumps or masses, mouth sores, ringing in ears or voice changes. Denies cough or wheezing.  Denies shortness of breath. Denies chest pain or palpitations. Denies leg  swelling. Denies abdominal distention, pain, blood in stools, constipation, diarrhea, nausea, vomiting, or early satiety. Denies pain with intercourse, dysuria, frequency, hematuria or incontinence. Denies hot flashes, pelvic pain, vaginal bleeding or vaginal discharge.   Denies joint pain or muscle pain/cramps. Denies itching, rash, or wounds. Denies dizziness, headaches, numbness or seizures. Denies swollen lymph nodes or glands, denies easy bruising or bleeding. Denies anxiety, depression, confusion, or decreased concentration.  Physical Exam: BP (!) 118/56 (BP Location: Left Arm, Patient Position: Sitting)   Pulse 67   Temp 98.4 F (36.9 C) (Oral)   Resp 19   Wt 141 lb (64 kg)   SpO2 100%   BMI 25.79 kg/m  General: Alert, oriented, no acute distress. HEENT: Posterior oropharynx  clear, sclera anicteric. Chest: Unlabored breathing on room air. Abdomen: soft, nontender.  Normoactive bowel sounds.  No masses or hepatosplenomegaly appreciated.  Well-healed incisions. Extremities: Grossly normal range of motion.  Warm, well perfused.  No edema bilaterally. GU: Normal appearing external genitalia without erythema, excoriation, or lesions.  Speculum exam reveals areas of vaginal suture intact along the right sidewall and distal posterior vagina.  No discharge or blood.  Cuff is intact with suture visible.  Bimanual exam reveals no fluctuance or tenderness to palpation.    Laboratory & Radiologic Studies: A. UTERUS, CERVIX, LEFT FALLOPIAN TUBE AND OVARY, TOTAL HYSTERECTOMY AND  SALPINGO-OOPHORECTOMY:  - Benign endometrial polyps.  - Background atrophic endometrium.  - Left ovary and fallopian tube with no specific pathologic change.   B. RIGHT FALLOPIAN TUBE AND OVARY, SALPINGO-OOPHORECTOMY:  - Benign ovarian fibroma with degenerative changes.  - Fallopian tube with no specific pathologic change.   FINAL MICROSCOPIC DIAGNOSIS:  - No malignant cells identified  - Reactive  mesothelial cells present   Assessment & Plan: Joann Carpenter is a 63 y.o. woman s/p TRH/BSO for complex adnexal mass and thickened endometrium, mildly elevated CA-125. Benign final pathology.  Overall doing very well.  Meeting postoperative milestones.  Discussed continued expectations and restrictions.  I do not see any evidence of infection on vaginal exam, do not appreciate an odor and see no vaginal discharge.  Discussed with patient's that there may have been some changes to floor of her vagina related to surgery, antibiotics, and prep.  I asked her to call if she notices any worsening of odor or if she begins having vaginal discharge.  We reviewed pathology from surgery.  She is very happy with benign results.  Patient will be discharged to her other medical providers.  18 minutes of total time was spent for this patient encounter, including preparation, face-to-face counseling with the patient and coordination of care, and documentation of the encounter.  Comer Dollar, MD  Division of Gynecologic Oncology  Department of Obstetrics and Gynecology  [facility]

## 2024-06-14 NOTE — Patient Instructions (Signed)
 It was good to see you today.  You are healing very well from surgery!  Please remember, no heavy lifting until 6 weeks after surgery and nothing in the vagina for 12 weeks.  Please do not hesitate to call if you need anything in the future.  The number here is [phone].

## 2024-06-18 ENCOUNTER — Other Ambulatory Visit: Payer: Self-pay | Admitting: Family Medicine

## 2024-06-18 NOTE — Telephone Encounter (Unsigned)
 Copied from CRM [id number]. Topic: Clinical - Medication Refill >> Jun 18, 2024  9:57 AM Janeecia G wrote: Medication: citalopram  40 mg  Has the patient contacted their pharmacy? No (Agent: If no, request that the patient contact the pharmacy for the refill. If patient does not wish to contact the pharmacy document the reason why and proceed with request.) (Agent: If yes, when and what did the pharmacy advise?)  This is the patient's preferred pharmacy:  [id number] - [place], [place] - [address] [place] [address] Phone: [phone] Fax: [phone]  Is this the correct pharmacy for this prescription? Yes If no, delete pharmacy and type the correct one.   Has the prescription been filled recently? No  Is the patient out of the medication? No ,  6 left  Has the patient been seen for an appointment in the last year OR does the patient have an upcoming appointment? Yes  Can we respond through MyChart? Yes  Agent: Please be advised that Rx refills may take up to 3 business days. We ask that you follow-up with your pharmacy.

## 2024-06-19 MED ORDER — CITALOPRAM HYDROBROMIDE 40 MG PO TABS: 40.0000 mg | ORAL_TABLET | Freq: Every day | ORAL | 1 refills | Status: DC

## 2024-06-19 NOTE — Telephone Encounter (Signed)
 Requested medication (s) are due for refill today: yes  Requested medication (s) are on the active medication list: yes  Last refill:  05/20/24  Future visit scheduled: yes  Notes to clinic:  Unable to refill per protocol, last refill by another/ ED provider. Routing to PCP for approval.     Requested Prescriptions  Pending Prescriptions Disp Refills   citalopram  (CELEXA ) 40 MG tablet      Sig: Take 1 tablet (40 mg total) by mouth daily.     Psychiatry:  Antidepressants - SSRI Failed - 06/19/2024 12:08 PM      Failed - Valid encounter within last 6 months    Recent Outpatient Visits   None            Passed - Completed PHQ-2 or PHQ-9 in the last 360 days

## 2024-06-24 ENCOUNTER — Encounter: Payer: Self-pay | Admitting: Family Medicine

## 2024-06-28 ENCOUNTER — Ambulatory Visit: Payer: Self-pay | Admitting: Family Medicine

## 2024-06-28 ENCOUNTER — Encounter: Payer: Self-pay | Admitting: Family Medicine

## 2024-06-28 VITALS — BP 140/82 | HR 71 | Temp 98.5°F | Ht 62.0 in | Wt 143.2 lb

## 2024-06-28 DIAGNOSIS — F32A Depression, unspecified: Secondary | ICD-10-CM

## 2024-06-28 DIAGNOSIS — E7800 Pure hypercholesterolemia, unspecified: Secondary | ICD-10-CM | POA: Diagnosis not present

## 2024-06-28 DIAGNOSIS — E1165 Type 2 diabetes mellitus with hyperglycemia: Secondary | ICD-10-CM | POA: Diagnosis not present

## 2024-06-28 DIAGNOSIS — Z23 Encounter for immunization: Secondary | ICD-10-CM

## 2024-06-28 DIAGNOSIS — K5900 Constipation, unspecified: Secondary | ICD-10-CM

## 2024-06-28 DIAGNOSIS — G4709 Other insomnia: Secondary | ICD-10-CM | POA: Diagnosis not present

## 2024-06-28 DIAGNOSIS — Z79899 Other long term (current) drug therapy: Secondary | ICD-10-CM | POA: Diagnosis not present

## 2024-06-28 DIAGNOSIS — Z7689 Persons encountering health services in other specified circumstances: Secondary | ICD-10-CM

## 2024-06-28 DIAGNOSIS — Z90710 Acquired absence of both cervix and uterus: Secondary | ICD-10-CM

## 2024-06-28 DIAGNOSIS — F419 Anxiety disorder, unspecified: Secondary | ICD-10-CM

## 2024-06-28 DIAGNOSIS — K219 Gastro-esophageal reflux disease without esophagitis: Secondary | ICD-10-CM

## 2024-06-28 MED ORDER — CITALOPRAM HYDROBROMIDE 40 MG PO TABS: 40.0000 mg | ORAL_TABLET | Freq: Every day | ORAL | 1 refills | Status: AC

## 2024-06-28 MED ORDER — ROSUVASTATIN CALCIUM 20 MG PO TABS: 20.0000 mg | ORAL_TABLET | Freq: Every day | ORAL | 3 refills | Status: AC

## 2024-06-28 MED ORDER — LISINOPRIL 5 MG PO TABS: 5.0000 mg | ORAL_TABLET | Freq: Every day | ORAL | 1 refills | Status: DC

## 2024-06-28 MED ORDER — ESOMEPRAZOLE MAGNESIUM 40 MG PO CPDR: 40.0000 mg | DELAYED_RELEASE_CAPSULE | Freq: Every day | ORAL | 1 refills | Status: AC

## 2024-06-28 MED ORDER — FENOFIBRATE 145 MG PO TABS: 145.0000 mg | ORAL_TABLET | Freq: Every day | ORAL | 1 refills | Status: DC

## 2024-06-28 MED ORDER — TRAZODONE HCL 50 MG PO TABS: 50.0000 mg | ORAL_TABLET | Freq: Every evening | ORAL | 1 refills | Status: DC | PRN

## 2024-06-28 NOTE — Progress Notes (Signed)
 Patient Office Visit  Assessment & Plan:  Type 2 diabetes mellitus with hyperglycemia, without long-term current use of insulin  (HCC) -     Lisinopril ; Take 1 tablet (5 mg total) by mouth daily.  Dispense: 90 tablet; Refill: 1 -     CBC with Differential/Platelet  Taking a statin medication  Encounter to establish care -     CBC with Differential/Platelet -     COMPLETE METABOLIC PANEL WITHOUT GFR -     TSH  Hypercholesterolemia -     Fenofibrate ; Take 1 tablet (145 mg total) by mouth daily.  Dispense: 90 tablet; Refill: 1 -     Rosuvastatin  Calcium ; Take 1 tablet (20 mg total) by mouth daily.  Dispense: 90 tablet; Refill: 3 -     COMPLETE METABOLIC PANEL WITHOUT GFR -     Lipid panel -     TSH  Other insomnia -     traZODone  HCl; Take 1 tablet (50 mg total) by mouth at bedtime as needed for sleep.  Dispense: 90 tablet; Refill: 1  Constipation, unspecified constipation type  Anxiety and depression -     Citalopram  Hydrobromide; Take 1 tablet (40 mg total) by mouth daily.  Dispense: 90 tablet; Refill: 1 -     COMPLETE METABOLIC PANEL WITHOUT GFR  Need for pneumococcal 20-valent conjugate vaccination -     Pneumococcal conjugate vaccine 20-valent  Gastroesophageal reflux disease without esophagitis -     Esomeprazole  Magnesium ; Take 1 capsule (40 mg total) by mouth daily at 12 noon.  Dispense: 90 capsule; Refill: 1 -     COMPLETE METABOLIC PANEL WITHOUT GFR -     TSH  H/O total hysterectomy   Assessment and Plan    Status post hysterectomy due to ovarian mass- benign  Post-operative recovery progressing well with benign pathology and resolved symptoms.  Constipation Chronic constipation exacerbated post-surgery, managed with Senexon. - Continue Senexon as needed. - Monitor bowel movements.  Gastroesophageal reflux disease (GERD) GERD controlled with current medication. Meloxicam may contribute to symptoms. - Discontinue meloxicam. - Continue GERD  medication.  Type 2 diabetes mellitus Type 2 diabetes well-controlled with A1c of 5.5%. Decision to maintain Ozempic  dosage. - Continue Ozempic  1 mg. - Monitor blood glucose.  Hyperlipidemia Plan to switch to Crestor  for better efficacy and tolerability. - Initiate Crestor .  Depression and anxiety Depression managed with citalopram  without side effects. - Refill citalopram .  Insomnia Insomnia managed with trazodone  without issues. - Continue trazodone .  General Health Maintenance Up to date on screenings. Pneumococcal vaccination recommended. - Administer pneumococcal vaccine.     Test results were reviewed and analyzed as part of the medical decision making of this visit. Reviewed previous notes from [facility], previous OB-GYN notes, operative reports and recents labs including A1C. Patient will stop NSAIDs due to abnormal kidney function Recommend healthy diet i.e mediterranean/DASH diet, consistent exercise - 30 minutes 5 day per week, and gradual weight loss. Increase fiber intake in her diet.  RTC 4 mos or sooner if nec. Patient will do labs at [facility] in [facility].  Return in about 4 months (around 10/28/2024), or if symptoms worsen or fail to improve.   Subjective:    Patient ID: Joann Carpenter, female    DOB: 1961/03/20  Age: 63 y.o. MRN: [id number]  Chief Complaint  Patient presents with   Medical Management of Chronic Issues   Establish Care    HPI Discussed the use of AI scribe software for clinical  note transcription with the patient, who gave verbal consent to proceed.  History of Present Illness        Joann Carpenter is a 63 year old female who presents for follow-up after recent complete hysterectomy due to ovarian mass removal. also here for follow up on chronic medical issues and establish care here.   She underwent total robotic hysterectomy for the removal of a ovarian mass. The surgery was performed as an outpatient procedure.  Prior to the surgery, she experienced symptoms for about eight months, including abdominal pain, nausea and vomiting, but no postmenopausal bleeding. She initially thought these symptoms might be related to her medication, Ozempic  or had diverticulitis, but later realized they were not. Postoperatively, her appetite has returned to normal, and she is feeling great.  She has been experiencing constipation, and reports that the constipation was present before the surgery. She reports having bowel movements approximately every three days, which is not her normal pattern. She is currently taking Senekot to manage this issue. The constipation was present before the surgery.  She has lost weight over the past year, with her weight decreasing from 172 lbs to 143 lbs. She attributes this weight loss to prolonged illness and poor appetite due to her symptoms. Her appetite has since returned, and she is eating healthily.  She is currently taking several medications, including meloxicam as needed, simvastatin for cholesterol, and citalopram  (Celexa ) for mood, with no side effects. She also takes trazodone  for sleep without issues. She monitors her blood sugar levels, which she states are 'pretty good,' and her last A1c was 5.5. She is on 1 mg of Ozempic  and is no longer taking metformin due to intolerance.  She reports no issues with reflux as long as she takes her medication. She is unsure about her use of Reglan  (metoclopramide ) and believes it may have been prescribed post-surgery for her stomach.  She is currently on a six-week break from work following her surgery. Physical Exam MEASUREMENTS: Weight- 143 lbs. Results LABS A1c: 5.5 (04/2024) Renal function: abnormal, possibly due to dehydration (04/2024)  RADIOLOGY Abdominal ultrasound: masses detected (04/2024)  PATHOLOGY Benign findings (05/23/2024) re ovarian mass Assessment & Plan Status post hysterectomy due to ovarian mass- benign   Post-operative recovery progressing well with benign pathology and resolved symptoms.  Constipation Chronic constipation exacerbated post-surgery, managed with Senexon. - Continue Senexon as needed. - Monitor bowel movements.  Gastroesophageal reflux disease (GERD) GERD controlled with current medication. Meloxicam may contribute to symptoms. - Discontinue meloxicam. - Continue GERD medication.  Type 2 diabetes mellitus Type 2 diabetes well-controlled with A1c of 5.5%. Decision to maintain Ozempic  dosage. - Continue Ozempic  1 mg. - Monitor blood glucose.  Hyperlipidemia Plan to switch to Crestor  for better efficacy and tolerability. - Initiate Crestor .  Depression and anxiety Depression managed with citalopram  without side effects. - Refill citalopram .  Insomnia Insomnia managed with trazodone  without issues. - Continue trazodone .  General Health Maintenance Up to date on screenings. Pneumococcal vaccination recommended. - Administer pneumococcal vaccine.    The 10-year ASCVD risk score (Arnett DK, et al., 2019) is: 9.1%  Past Medical History:  Diagnosis Date   Anxiety    Arthritis    Depression    Diabetes mellitus without complication (HCC)    GERD (gastroesophageal reflux disease)    Hypocholesteremia    Past Surgical History:  Procedure Laterality Date   CYST REMOVAL HAND Right 1975   wrist   ROBOTIC ASSISTED TOTAL HYSTERECTOMY WITH BILATERAL SALPINGO  OOPHERECTOMY Bilateral 05/23/2024   Procedure: HYSTERECTOMY, TOTAL, ROBOT-ASSISTED, LAPAROSCOPIC, WITH BILATERAL SALPINGO-OOPHORECTOMY;  Surgeon: Viktoria Comer SAUNDERS, MD;  Location: [facility];  Service: Gynecology;  Laterality: Bilateral;   ROOT CANAL     Social History   Tobacco Use   Smoking status: Never   Smokeless tobacco: Never  Vaping Use   Vaping status: Never Used  Substance Use Topics   Alcohol use: Yes    Comment: occasionally    Drug use: Never   Family History  Problem Relation Age of  Onset   Cancer Father        unsure type   Ovarian cancer Maternal Grandmother    Colon cancer Neg Hx    Breast cancer Neg Hx    Endometrial cancer Neg Hx    Pancreatic cancer Neg Hx    Prostate cancer Neg Hx    Allergies  Allergen Reactions   Codeine Hives    ROS    Objective:    BP (!) 140/82   Pulse 71   Temp 98.5 F (36.9 C)   Ht 5' 2 (1.575 m)   Wt 143 lb 4 oz (65 kg)   SpO2 99%   BMI 26.20 kg/m  BP Readings from Last 3 Encounters:  06/28/24 (!) 140/82  06/14/24 (!) 118/56  05/23/24 (!) 117/59   Wt Readings from Last 3 Encounters:  06/28/24 143 lb 4 oz (65 kg)  06/14/24 141 lb (64 kg)  05/23/24 138 lb (62.6 kg)    Physical Exam Vitals and nursing note reviewed.  Constitutional:      General: She is not in acute distress.    Appearance: Normal appearance.  HENT:     Head: Normocephalic.     Right Ear: Tympanic membrane, ear canal and external ear normal.     Left Ear: Tympanic membrane, ear canal and external ear normal.  Eyes:     Extraocular Movements: Extraocular movements intact.     Pupils: Pupils are equal, round, and reactive to light.  Cardiovascular:     Rate and Rhythm: Normal rate and regular rhythm.     Heart sounds: Normal heart sounds.  Pulmonary:     Effort: Pulmonary effort is normal.     Breath sounds: Normal breath sounds. No wheezing.  Abdominal:     Tenderness: There is no guarding or rebound.     Comments: Surgical scars from robotic hysterectomy healing well.   Musculoskeletal:     Right lower leg: No edema.     Left lower leg: No edema.  Neurological:     General: No focal deficit present.     Mental Status: She is alert and oriented to person, place, and time.  Psychiatric:        Mood and Affect: Mood normal.        Behavior: Behavior normal.        Thought Content: Thought content normal.        Judgment: Judgment normal.      No results found for any visits on 06/28/24.

## 2024-07-01 ENCOUNTER — Encounter: Payer: Self-pay | Admitting: Family Medicine

## 2024-09-16 ENCOUNTER — Other Ambulatory Visit: Payer: Self-pay | Admitting: Family Medicine

## 2024-09-16 DIAGNOSIS — E7800 Pure hypercholesterolemia, unspecified: Secondary | ICD-10-CM

## 2024-09-16 DIAGNOSIS — E1165 Type 2 diabetes mellitus with hyperglycemia: Secondary | ICD-10-CM

## 2024-10-07 ENCOUNTER — Other Ambulatory Visit: Payer: Self-pay | Admitting: Family Medicine

## 2024-10-07 DIAGNOSIS — E1165 Type 2 diabetes mellitus with hyperglycemia: Secondary | ICD-10-CM

## 2024-10-07 DIAGNOSIS — E7800 Pure hypercholesterolemia, unspecified: Secondary | ICD-10-CM

## 2024-10-08 NOTE — Telephone Encounter (Signed)
 Requested Prescriptions  Pending Prescriptions Disp Refills   Semaglutide , 1 MG/DOSE, (OZEMPIC , 1 MG/DOSE,) 4 MG/3ML SOPN [Pharmacy Med Name: Ozempic  (1 MG/DOSE) 4 MG/3ML Subcutaneous Solution Pen-injector] 3 mL 2    Sig: INJECT 1MG   SUBCUTANEOUSLY ONCE A WEEK     Endocrinology:  Diabetes - GLP-1 Receptor Agonists - semaglutide  Passed - 10/08/2024  5:05 PM      Passed - HBA1C in normal range and within 180 days    Hgb A1c MFr Bld  Date Value Ref Range Status  05/17/2024 5.5 4.8 - 5.6 % Final    Comment:    (NOTE) Diagnosis of Diabetes The following HbA1c ranges recommended by the American Diabetes Association (ADA) may be used as an aid in the diagnosis of diabetes mellitus.  Hemoglobin             Suggested A1C NGSP%              Diagnosis  <5.7                   Non Diabetic  5.7-6.4                Pre-Diabetic  >6.4                   Diabetic  <7.0                   Glycemic control for                       adults with diabetes.           Passed - Cr in normal range and within 360 days    Creatinine, Ser  Date Value Ref Range Status  05/23/2024 0.89 0.44 - 1.00 mg/dL Final         Passed - Valid encounter within last 6 months    Recent Outpatient Visits           3 months ago Type 2 diabetes mellitus with hyperglycemia, without long-term current use of insulin  [facility])   [place] [facility] Aletha Bene, MD

## 2024-11-19 ENCOUNTER — Ambulatory Visit: Payer: Self-pay

## 2024-11-19 NOTE — Telephone Encounter (Signed)
 FYI Only or Action Required?: Action required by provider: request for appointment and update on patient condition.  Patient was last seen in primary care on 06/28/2024 by Aletha Bene, MD.  Called Nurse Triage reporting Depression.  Symptoms began 4 days ago.  Interventions attempted: Prescription medications: Celexa .  Symptoms are: gradually worsening.  Triage Disposition: See Physician Within 24 Hours  Patient/caregiver understands and will follow disposition?: No, refuses disposition Reason for Disposition  [1] Depression AND [2] getting worse (e.g., sleeping poorly, less able to do activities of daily living)  Answer Assessment - Initial Assessment Questions Patient called in to schedule with pcp today or tomorrow for increasing depression symptoms. She is maintained on Celexa  40 mg for years with no missed doses, she reports Celexa  generally works well for her but the past 4 days she has just been down and crying all the time, she is asking for appointment with pcp to adjust medication. She does endorse suicidal ideation on 11/18/24 that she states she would never act on. Denies suicidal ideation at this time. Trigger: four days ago she was cleaning out her attic and came across items that reminded her of her lost children.   Next available appointment with pcp in on Jan 2. Can she be worked into [employer] schedule today or tomorrow? Prefers virtual unless after 4pm due to work schedule.   1. CONCERN: What happened that made you call today?     4 days depression symptoms increased  2. DEPRESSION SYMPTOM SCREENING: How are you feeling overall? (e.g., decreased energy, increased sleeping or difficulty sleeping, difficulty concentrating, feelings of sadness, guilt, hopelessness, or worthlessness)     sadness 3. RISK OF HARM - SUICIDAL IDEATION:  Do you ever have thoughts of hurting or killing yourself?  (e.g., yes, no, no but preoccupation with thoughts about death)     Last  night had thoughts of not wanting to be around  4. RISK OF HARM - HOMICIDAL IDEATION:  Do you ever have thoughts of hurting or killing someone else?  (e.g., yes, no, no but preoccupation with thoughts about death)     denies 5. FUNCTIONAL IMPAIRMENT: How have things been going for you overall? Have you had more difficulty than usual doing your normal daily activities?  (e.g., better, same, worse; self-care, school, work, interactions)     Able to care for self 6. SUPPORT: Who is with you now? Who do you live with? Do you have family or friends who you can talk to?      Husband-supportive 7. THERAPIST: Do you have a counselor or therapist? If Yes, ask: What is their name?     No and not interested 8. STRESSORS: Has there been any new stress or recent changes in your life?     Cleaning out attic-reminded her of children 9. ALCOHOL USE OR SUBSTANCE USE (DRUG USE): Do you drink alcohol or use any illegal drugs?     Denies substances. Social during holidays 10. OTHER: Do you have any other physical symptoms right now? (e.g., fever)  Protocols used: Depression-A-AH Copied from CRM #[id number]. Topic: Clinical - Red Word Triage >> Nov 19, 2024  8:18 AM Larissa RAMAN wrote: [facility] that prompted transfer to Nurse Triage: Depression

## 2024-11-29 ENCOUNTER — Encounter: Payer: Self-pay | Admitting: Family Medicine

## 2024-11-29 ENCOUNTER — Ambulatory Visit: Admitting: Family Medicine

## 2024-11-29 VITALS — BP 128/74 | HR 67 | Ht 62.0 in | Wt 142.4 lb

## 2024-11-29 DIAGNOSIS — R32 Unspecified urinary incontinence: Secondary | ICD-10-CM

## 2024-11-29 DIAGNOSIS — G4709 Other insomnia: Secondary | ICD-10-CM | POA: Diagnosis not present

## 2024-11-29 DIAGNOSIS — Z79899 Other long term (current) drug therapy: Secondary | ICD-10-CM

## 2024-11-29 DIAGNOSIS — E1165 Type 2 diabetes mellitus with hyperglycemia: Secondary | ICD-10-CM | POA: Diagnosis not present

## 2024-11-29 DIAGNOSIS — R232 Flushing: Secondary | ICD-10-CM

## 2024-11-29 DIAGNOSIS — Z90710 Acquired absence of both cervix and uterus: Secondary | ICD-10-CM

## 2024-11-29 DIAGNOSIS — Z0001 Encounter for general adult medical examination with abnormal findings: Secondary | ICD-10-CM | POA: Diagnosis not present

## 2024-11-29 DIAGNOSIS — F419 Anxiety disorder, unspecified: Secondary | ICD-10-CM | POA: Diagnosis not present

## 2024-11-29 DIAGNOSIS — E7800 Pure hypercholesterolemia, unspecified: Secondary | ICD-10-CM | POA: Diagnosis not present

## 2024-11-29 DIAGNOSIS — F32A Depression, unspecified: Secondary | ICD-10-CM | POA: Diagnosis not present

## 2024-11-29 DIAGNOSIS — Z0000 Encounter for general adult medical examination without abnormal findings: Secondary | ICD-10-CM

## 2024-11-29 MED ORDER — TRAZODONE HCL 50 MG PO TABS: 50.0000 mg | ORAL_TABLET | Freq: Every evening | ORAL | 3 refills | Status: AC | PRN

## 2024-11-29 MED ORDER — ESTRADIOL 0.0375 MG/24HR TD PTTW: 1.0000 | MEDICATED_PATCH | TRANSDERMAL | 12 refills | Status: AC

## 2024-11-29 NOTE — Progress Notes (Signed)
 "  Complete physical exam  Assessment & Plan:    Routine Health Maintenance and Physical Exam Discussed health benefits of physical activity, and encouraged her to engage in regular exercise appropriate for her age and condition.  Preventative health care -     CBC with Differential/Platelet -     Comprehensive metabolic panel with GFR -     Lipid panel -     TSH  Other insomnia -     traZODone  HCl; Take 1 tablet (50 mg total) by mouth at bedtime as needed for sleep.  Dispense: 90 tablet; Refill: 3  Type 2 diabetes mellitus with hyperglycemia, without long-term current use of insulin  (HCC) -     Hemoglobin A1c  Anxiety and depression  Taking a statin medication -     Comprehensive metabolic panel with GFR  Hypercholesterolemia -     Lipid panel  H/O total hysterectomy -     Estradiol ; Place 1 patch onto the skin 2 (two) times a week.  Dispense: 8 patch; Refill: 12  Vasomotor flushing -     Estradiol ; Place 1 patch onto the skin 2 (two) times a week.  Dispense: 8 patch; Refill: 12 -     TSH  Urinary incontinence, unspecified type -     Ambulatory referral to Urology   Will start Estrogen patch 2x per week (low dose)- she will keep us  posted on how this works for her. Urology consult ordered [facility].  Recommend healthy diet i.e mediterranean/DASH diet, consistent exercise - 30 minutes 5 day per week, and gradual weight loss. Return in about 4 months (around 03/29/2025).        Subjective:  Patient ID: Joann Carpenter, female    DOB: 1961/03/30  Age: 64 y.o. MRN: [id number] Chief Complaint  Patient presents with   Annual Exam    Pt is present for her annual physical.    Joann Carpenter is a 64 y.o. female who presents today for a complete physical exam. Colonoscopy-UTD DEXA scan-not yet Tetanus-UTD Shingrix- has received Pap smear- UTD, had hysterectomy History of Present Illness Joann Carpenter is a 64 year old female who presents with depression  exacerbation due to medication error and post-hysterectomy symptoms, also here for complete physical exam.   She experienced a recent exacerbation of her depression symptoms after mistakenly taking cholesterol medication instead of her prescribed antidepressant, Celexa  (citalopram ), for approximately four weeks. She realized the error two days ago and resumed taking Celexa  yesterday. During the period without her antidepressant, she experienced severe depressive symptoms, including suicidal thoughts, crying, and an inability to engage in social activities. She has supportive friends and family who were concerned about her well-being during this time.  She reports post-hysterectomy symptoms, including night sweats and memory issues, which began approximately eight to nine weeks after her surgery. She has not been on any hormone replacement therapy since her surgery, and there was no follow-up regarding these symptoms post-surgery.  Additionally, she experiences urinary incontinence since her hysterectomy, which occurs without control and was not present before the surgery. She uses pads to manage this symptom. Patient would like to see Urology re this.   Her sleep has been poor, and she has been out of trazodone  for about two weeks, although she reports it is effective when taken. She maintains an active lifestyle through her job, which involves a lot of physical activity, and she generally eats healthily, cooking most meals at home.  Assessment and Plan Major  depressive disorder Exacerbation due to accidental citalopram  discontinuation. Symptoms improved with resumption. - Continue citalopram  as prescribed. - Encouraged use of a pill box to prevent medication confusion. - Discussed importance of medication adherence and potential consequences of discontinuation.  Insomnia Chronic insomnia managed with trazodone , currently out of medication. - Prescribed trazodone  to manage insomnia.  Menopausal  symptoms following total hysterectomy Symptoms include night sweats and memory issues. Hormone therapy considered due to absence of cancer and family history of breast cancer. Gabapentin discussed but not preferred. - Prescribed estrogen patch, one patch twice a week. - Sent prescription to pharmacy. Type 2 diabetes- will recheck labs today.  Hyperlipidemia-denies unusual muscle aches or muscle cramps or difficulty tolerating statin therapy.  Aware of need for diet control, exercise and healthy eating.   Urinary incontinence New onset post-hysterectomy, occurring with coughing and sneezing. - Referred to urologist for evaluation.  General health maintenance Vaccinations, mammogram, and colonoscopy are current. Engages in regular physical activity. - Continue current health maintenance schedule. The 10-year ASCVD risk score (Arnett DK, et al., 2019) is: 7.7%   Values used to calculate the score:     Age: 36 years     Clinically relevant sex: Female     Is Non-Hispanic African American: No     Diabetic: Yes     Tobacco smoker: No     Systolic Blood Pressure: 128 mmHg     Is BP treated: No     HDL Cholesterol: 48 mg/dL     Total Cholesterol: 151 mg/dL  Health Maintenance  Topic Date Due   Complete foot exam   Never done   Eye exam for diabetics  Never done   HIV Screening  Never done   Yearly kidney health urinalysis for diabetes  Never done   Hepatitis C Screening  Never done   COVID-19 Vaccine (5 - 2025-26 season) 07/22/2024   Hemoglobin A1C  11/16/2024   Flu Shot  02/18/2025*   Yearly kidney function blood test for diabetes  05/23/2025   Breast Cancer Screening  06/19/2026   DTaP/Tdap/Td vaccine (3 - Td or Tdap) 10/08/2028   Colon Cancer Screening  08/27/2033   Pneumococcal Vaccine for age over 54  Completed   Zoster (Shingles) Vaccine  Completed   Hepatitis B Vaccine  Aged Out   HPV Vaccine  Aged Out   Meningitis B Vaccine  Aged Out  *Topic was postponed. The date shown  is not the original due date.    Most recent fall risk assessment:    06/28/2024    3:17 PM  Fall Risk   Falls in the past year? 0     Most recent depression screenings:    06/28/2024    3:17 PM 05/16/2024    3:24 PM  PHQ 2/9 Scores  PHQ - 2 Score 0 0  PHQ- 9 Score 1       Data saved with a previous flowsheet row definition      The 10-year ASCVD risk score (Arnett DK, et al., 2019) is: 7.7%   Values used to calculate the score:     Age: 47 years     Clinically relevant sex: Female     Is Non-Hispanic African American: No     Diabetic: Yes     Tobacco smoker: No     Systolic Blood Pressure: 128 mmHg     Is BP treated: No     HDL Cholesterol: 48 mg/dL     Total Cholesterol: 151 mg/dL  Past Surgical History:  Procedure Laterality Date   CYST REMOVAL HAND Right 1975   wrist   ROBOTIC ASSISTED TOTAL HYSTERECTOMY WITH BILATERAL SALPINGO OOPHERECTOMY Bilateral 05/23/2024   Procedure: HYSTERECTOMY, TOTAL, ROBOT-ASSISTED, LAPAROSCOPIC, WITH BILATERAL SALPINGO-OOPHORECTOMY;  Surgeon: Viktoria Comer SAUNDERS, MD;  Location: [facility];  Service: Gynecology;  Laterality: Bilateral;   ROOT CANAL     Social History[1] Social History   Socioeconomic History   Marital status: Married    Spouse name: Not on file   Number of children: Not on file   Years of education: Not on file   Highest education level: Not on file  Occupational History   Not on file  Tobacco Use   Smoking status: Never   Smokeless tobacco: Never  Vaping Use   Vaping status: Never Used  Substance and Sexual Activity   Alcohol use: Yes    Comment: occasionally    Drug use: Never   Sexual activity: Yes  Other Topics Concern   Not on file  Social History Narrative   Not on file   Social Drivers of Health   Tobacco Use: Low Risk (11/29/2024)   Patient History    Smoking Tobacco Use: Never    Smokeless Tobacco Use: Never    Passive Exposure: Not on file  Financial Resource Strain: Not on file  Food  Insecurity: No Food Insecurity (05/16/2024)   Epic    Worried About Programme Researcher, Broadcasting/film/video in the Last Year: Never true    Ran Out of Food in the Last Year: Never true  Transportation Needs: No Transportation Needs (05/16/2024)   Epic    Lack of Transportation (Medical): No    Lack of Transportation (Non-Medical): No  Physical Activity: Not on file  Stress: Not on file  Social Connections: Not on file  Intimate Partner Violence: Not on file  Depression (PHQ2-9): Low Risk (06/28/2024)   Depression (PHQ2-9)    PHQ-2 Score: 1  Alcohol Screen: Not on file  Housing: Low Risk (05/16/2024)   Epic    Unable to Pay for Housing in the Last Year: No    Number of Times Moved in the Last Year: 0    Homeless in the Last Year: No  Utilities: Not At Risk (05/16/2024)   Epic    Threatened with loss of utilities: No  Health Literacy: Not on file   Family History  Problem Relation Age of Onset   Cancer Father        unsure type   Ovarian cancer Maternal Grandmother    Colon cancer Neg Hx    Breast cancer Neg Hx    Endometrial cancer Neg Hx    Pancreatic cancer Neg Hx    Prostate cancer Neg Hx    Allergies[2]   Patient Care Team: Aletha Bene, MD as PCP - General (Family Medicine)   Show/hide medication list[3]  ROS     Objective:    BP 128/74 (BP Location: Left Arm, Patient Position: Sitting, Cuff Size: Normal)   Pulse 67   Ht 5' 2 (1.575 m)   Wt 142 lb 6.4 oz (64.6 kg)   SpO2 98%   BMI 26.05 kg/m  BP Readings from Last 3 Encounters:  11/29/24 128/74  06/28/24 (!) 140/82  06/14/24 (!) 118/56   Wt Readings from Last 3 Encounters:  11/29/24 142 lb 6.4 oz (64.6 kg)  06/28/24 143 lb 4 oz (65 kg)  06/14/24 141 lb (64 kg)    Physical Exam Vitals and nursing note  reviewed.  Constitutional:      General: She is not in acute distress.    Appearance: Normal appearance.  HENT:     Head: Normocephalic.     Right Ear: Tympanic membrane, ear canal and external ear normal.      Left Ear: Tympanic membrane, ear canal and external ear normal.  Eyes:     Extraocular Movements: Extraocular movements intact.     Conjunctiva/sclera: Conjunctivae normal.     Pupils: Pupils are equal, round, and reactive to light.  Cardiovascular:     Rate and Rhythm: Normal rate and regular rhythm.     Heart sounds: Normal heart sounds.  Pulmonary:     Effort: Pulmonary effort is normal. No respiratory distress.     Breath sounds: Normal breath sounds.  Chest:  Breasts:    Right: No inverted nipple, mass, nipple discharge, skin change or tenderness.     Left: No inverted nipple, mass, nipple discharge, skin change or tenderness.  Musculoskeletal:        General: Normal range of motion.     Right lower leg: No edema.     Left lower leg: No edema.  Neurological:     General: No focal deficit present.     Mental Status: She is alert and oriented to person, place, and time. Mental status is at baseline.     Cranial Nerves: Cranial nerves 2-12 are intact. No cranial nerve deficit.     Motor: No weakness.     Coordination: Coordination is intact. Romberg sign negative. Coordination normal. Finger-Nose-Finger Test normal.     Gait: Gait is intact. Gait normal.     Deep Tendon Reflexes: Reflexes are normal and symmetric. Reflexes normal.  Psychiatric:        Attention and Perception: Attention normal.        Mood and Affect: Mood normal. Mood is not anxious. Affect is not tearful or inappropriate.        Speech: Speech normal.        Behavior: Behavior normal.        Thought Content: Thought content normal.        Cognition and Memory: Cognition and memory normal.        Judgment: Judgment normal.      No results found for any visits on 11/29/24.      Connie Emperor, MD     [1]  Social History Tobacco Use   Smoking status: Never   Smokeless tobacco: Never  Vaping Use   Vaping status: Never Used  Substance Use Topics   Alcohol use: Yes    Comment: occasionally     Drug use: Never  [2]  Allergies Allergen Reactions   Codeine Hives  [3]  Outpatient Medications Prior to Visit  Medication Sig   acetaminophen  (TYLENOL ) 325 MG tablet Take 650 mg by mouth daily as needed for mild pain (pain score 1-3) or moderate pain (pain score 4-6).   citalopram  (CELEXA ) 40 MG tablet Take 1 tablet (40 mg total) by mouth daily.   esomeprazole  (NEXIUM ) 40 MG capsule Take 1 capsule (40 mg total) by mouth daily at 12 noon.   fenofibrate  (TRICOR ) 145 MG tablet Take 1 tablet (145 mg total) by mouth daily.   lisinopril  (ZESTRIL ) 5 MG tablet Take 1 tablet (5 mg total) by mouth daily.   metoCLOPramide  (REGLAN ) 5 MG tablet Take 5 mg by mouth 4 (four) times daily.   rosuvastatin  (CRESTOR ) 20 MG tablet Take 1 tablet (20 mg  total) by mouth daily.   Semaglutide , 1 MG/DOSE, (OZEMPIC , 1 MG/DOSE,) 4 MG/3ML SOPN INJECT 1MG   SUBCUTANEOUSLY ONCE A WEEK   senna-docusate (SENOKOT-S) 8.6-50 MG tablet Take 2 tablets by mouth at bedtime. For AFTER surgery, do not take if having diarrhea   simvastatin (ZOCOR) 40 MG tablet Take 40 mg by mouth at bedtime.   [DISCONTINUED] traZODone  (DESYREL ) 50 MG tablet Take 1 tablet (50 mg total) by mouth at bedtime as needed for sleep.   No facility-administered medications prior to visit.   "

## 2024-11-30 LAB — COMPREHENSIVE METABOLIC PANEL WITH GFR
AG Ratio: 2.3 (calc) (ref 1.0–2.5)
ALT: 22 U/L (ref 6–29)
AST: 16 U/L (ref 10–35)
Albumin: 4.6 g/dL (ref 3.6–5.1)
Alkaline phosphatase (APISO): 73 U/L (ref 37–153)
BUN: 16 mg/dL (ref 7–25)
CO2: 28 mmol/L (ref 20–32)
Calcium: 9.6 mg/dL (ref 8.6–10.4)
Chloride: 102 mmol/L (ref 98–110)
Creat: 0.8 mg/dL (ref 0.50–1.05)
Globulin: 2 g/dL (ref 1.9–3.7)
Glucose, Bld: 164 mg/dL — ABNORMAL HIGH (ref 65–99)
Potassium: 3.6 mmol/L (ref 3.5–5.3)
Sodium: 139 mmol/L (ref 135–146)
Total Bilirubin: 0.9 mg/dL (ref 0.2–1.2)
Total Protein: 6.6 g/dL (ref 6.1–8.1)
eGFR: 83 mL/min/1.73m2

## 2024-11-30 LAB — CBC WITH DIFFERENTIAL/PLATELET
Absolute Lymphocytes: 2288 {cells}/uL (ref 850–3900)
Absolute Monocytes: 520 {cells}/uL (ref 200–950)
Basophils Absolute: 64 {cells}/uL (ref 0–200)
Basophils Relative: 0.8 %
Eosinophils Absolute: 72 {cells}/uL (ref 15–500)
Eosinophils Relative: 0.9 %
HCT: 43.9 % (ref 35.9–46.0)
Hemoglobin: 15 g/dL (ref 11.7–15.5)
MCH: 30.4 pg (ref 27.0–33.0)
MCHC: 34.2 g/dL (ref 31.6–35.4)
MCV: 89 fL (ref 81.4–101.7)
MPV: 11.5 fL (ref 7.5–12.5)
Monocytes Relative: 6.5 %
Neutro Abs: 5056 {cells}/uL (ref 1500–7800)
Neutrophils Relative %: 63.2 %
Platelets: 305 Thousand/uL (ref 140–400)
RBC: 4.93 Million/uL (ref 3.80–5.10)
RDW: 12.6 % (ref 11.0–15.0)
Total Lymphocyte: 28.6 %
WBC: 8 Thousand/uL (ref 3.8–10.8)

## 2024-11-30 LAB — HEMOGLOBIN A1C
Hgb A1c MFr Bld: 6 % — ABNORMAL HIGH
Mean Plasma Glucose: 126 mg/dL
eAG (mmol/L): 7 mmol/L

## 2024-11-30 LAB — LIPID PANEL
Cholesterol: 149 mg/dL
HDL: 66 mg/dL
LDL Cholesterol (Calc): 52 mg/dL
Non-HDL Cholesterol (Calc): 83 mg/dL
Total CHOL/HDL Ratio: 2.3 (calc)
Triglycerides: 261 mg/dL — ABNORMAL HIGH

## 2024-11-30 LAB — TSH: TSH: 0.25 m[IU]/L — ABNORMAL LOW (ref 0.40–4.50)

## 2024-12-02 ENCOUNTER — Ambulatory Visit: Payer: Self-pay | Admitting: Family Medicine

## 2024-12-20 ENCOUNTER — Other Ambulatory Visit: Payer: Self-pay | Admitting: Family Medicine

## 2024-12-20 DIAGNOSIS — E1165 Type 2 diabetes mellitus with hyperglycemia: Secondary | ICD-10-CM

## 2024-12-20 DIAGNOSIS — E7800 Pure hypercholesterolemia, unspecified: Secondary | ICD-10-CM

## 2025-05-05 ENCOUNTER — Ambulatory Visit: Admitting: Family Medicine
